# Patient Record
Sex: Male | Born: 1935 | Race: White | Hispanic: No | Marital: Single | Smoking: Former smoker
Health system: Southern US, Community
[De-identification: ages and names within clinical notes are randomized; demographics above are authoritative.]

## PROBLEM LIST (undated history)

## (undated) DIAGNOSIS — M1990 Unspecified osteoarthritis, unspecified site: Secondary | ICD-10-CM

## (undated) DIAGNOSIS — I10 Essential (primary) hypertension: Secondary | ICD-10-CM

## (undated) DIAGNOSIS — I456 Pre-excitation syndrome: Secondary | ICD-10-CM

## (undated) DIAGNOSIS — I639 Cerebral infarction, unspecified: Secondary | ICD-10-CM

## (undated) DIAGNOSIS — I4891 Unspecified atrial fibrillation: Secondary | ICD-10-CM

## (undated) DIAGNOSIS — M109 Gout, unspecified: Secondary | ICD-10-CM

## (undated) DIAGNOSIS — C679 Malignant neoplasm of bladder, unspecified: Secondary | ICD-10-CM

## (undated) DIAGNOSIS — L8990 Pressure ulcer of unspecified site, unspecified stage: Secondary | ICD-10-CM

## (undated) DIAGNOSIS — E039 Hypothyroidism, unspecified: Secondary | ICD-10-CM

## (undated) DIAGNOSIS — E785 Hyperlipidemia, unspecified: Secondary | ICD-10-CM

## (undated) DIAGNOSIS — D649 Anemia, unspecified: Secondary | ICD-10-CM

## (undated) DIAGNOSIS — Z8719 Personal history of other diseases of the digestive system: Secondary | ICD-10-CM

## (undated) DIAGNOSIS — I714 Abdominal aortic aneurysm, without rupture, unspecified: Secondary | ICD-10-CM

## (undated) DIAGNOSIS — S32402A Unspecified fracture of left acetabulum, initial encounter for closed fracture: Secondary | ICD-10-CM

## (undated) HISTORY — DX: Abdominal aortic aneurysm, without rupture, unspecified: I71.40

## (undated) HISTORY — PX: CHOLECYSTECTOMY: SHX55

## (undated) HISTORY — DX: Abdominal aortic aneurysm, without rupture: I71.4

## (undated) HISTORY — PX: TONSILLECTOMY: SUR1361

## (undated) HISTORY — DX: Pre-excitation syndrome: I45.6

## (undated) HISTORY — PX: CATARACT EXTRACTION: SUR2

## (undated) HISTORY — DX: Anemia, unspecified: D64.9

## (undated) HISTORY — DX: Unspecified fracture of left acetabulum, initial encounter for closed fracture: S32.402A

## (undated) HISTORY — PX: KNEE SURGERY: SHX244

## (undated) HISTORY — DX: Unspecified osteoarthritis, unspecified site: M19.90

---

## 2012-08-13 DIAGNOSIS — H903 Sensorineural hearing loss, bilateral: Secondary | ICD-10-CM | POA: Insufficient documentation

## 2012-08-13 DIAGNOSIS — H905 Unspecified sensorineural hearing loss: Secondary | ICD-10-CM | POA: Insufficient documentation

## 2013-03-04 DIAGNOSIS — I10 Essential (primary) hypertension: Secondary | ICD-10-CM | POA: Diagnosis not present

## 2013-04-10 DIAGNOSIS — K921 Melena: Secondary | ICD-10-CM | POA: Diagnosis not present

## 2013-04-10 DIAGNOSIS — I6350 Cerebral infarction due to unspecified occlusion or stenosis of unspecified cerebral artery: Secondary | ICD-10-CM | POA: Diagnosis not present

## 2013-04-10 DIAGNOSIS — K625 Hemorrhage of anus and rectum: Secondary | ICD-10-CM | POA: Diagnosis not present

## 2013-04-10 DIAGNOSIS — Z8673 Personal history of transient ischemic attack (TIA), and cerebral infarction without residual deficits: Secondary | ICD-10-CM | POA: Insufficient documentation

## 2013-04-10 DIAGNOSIS — E039 Hypothyroidism, unspecified: Secondary | ICD-10-CM | POA: Diagnosis present

## 2013-04-10 DIAGNOSIS — Z8551 Personal history of malignant neoplasm of bladder: Secondary | ICD-10-CM | POA: Diagnosis not present

## 2013-04-10 DIAGNOSIS — K5730 Diverticulosis of large intestine without perforation or abscess without bleeding: Secondary | ICD-10-CM | POA: Diagnosis not present

## 2013-04-10 DIAGNOSIS — D649 Anemia, unspecified: Secondary | ICD-10-CM | POA: Diagnosis not present

## 2013-04-10 DIAGNOSIS — M129 Arthropathy, unspecified: Secondary | ICD-10-CM | POA: Diagnosis not present

## 2013-04-10 DIAGNOSIS — K922 Gastrointestinal hemorrhage, unspecified: Secondary | ICD-10-CM | POA: Diagnosis not present

## 2013-04-10 DIAGNOSIS — I10 Essential (primary) hypertension: Secondary | ICD-10-CM | POA: Diagnosis not present

## 2013-04-10 DIAGNOSIS — K5731 Diverticulosis of large intestine without perforation or abscess with bleeding: Secondary | ICD-10-CM | POA: Diagnosis not present

## 2013-04-22 DIAGNOSIS — I10 Essential (primary) hypertension: Secondary | ICD-10-CM | POA: Diagnosis not present

## 2013-05-20 DIAGNOSIS — R279 Unspecified lack of coordination: Secondary | ICD-10-CM | POA: Diagnosis not present

## 2013-05-20 DIAGNOSIS — I10 Essential (primary) hypertension: Secondary | ICD-10-CM | POA: Diagnosis not present

## 2013-05-20 DIAGNOSIS — Z79899 Other long term (current) drug therapy: Secondary | ICD-10-CM | POA: Diagnosis not present

## 2013-05-22 DIAGNOSIS — R279 Unspecified lack of coordination: Secondary | ICD-10-CM | POA: Diagnosis not present

## 2013-05-22 DIAGNOSIS — I10 Essential (primary) hypertension: Secondary | ICD-10-CM | POA: Diagnosis not present

## 2013-05-27 DIAGNOSIS — M47812 Spondylosis without myelopathy or radiculopathy, cervical region: Secondary | ICD-10-CM | POA: Diagnosis not present

## 2013-05-27 DIAGNOSIS — M5137 Other intervertebral disc degeneration, lumbosacral region: Secondary | ICD-10-CM | POA: Diagnosis not present

## 2013-05-27 DIAGNOSIS — M48061 Spinal stenosis, lumbar region without neurogenic claudication: Secondary | ICD-10-CM | POA: Diagnosis not present

## 2013-05-27 DIAGNOSIS — R29898 Other symptoms and signs involving the musculoskeletal system: Secondary | ICD-10-CM | POA: Diagnosis not present

## 2013-05-27 DIAGNOSIS — R279 Unspecified lack of coordination: Secondary | ICD-10-CM | POA: Diagnosis not present

## 2013-05-27 DIAGNOSIS — M5126 Other intervertebral disc displacement, lumbar region: Secondary | ICD-10-CM | POA: Diagnosis not present

## 2013-05-28 DIAGNOSIS — N401 Enlarged prostate with lower urinary tract symptoms: Secondary | ICD-10-CM | POA: Diagnosis not present

## 2013-05-28 DIAGNOSIS — R351 Nocturia: Secondary | ICD-10-CM | POA: Diagnosis not present

## 2013-05-28 DIAGNOSIS — N138 Other obstructive and reflux uropathy: Secondary | ICD-10-CM | POA: Diagnosis not present

## 2013-05-28 DIAGNOSIS — Z8551 Personal history of malignant neoplasm of bladder: Secondary | ICD-10-CM | POA: Diagnosis not present

## 2013-06-03 DIAGNOSIS — R269 Unspecified abnormalities of gait and mobility: Secondary | ICD-10-CM | POA: Diagnosis not present

## 2013-06-03 DIAGNOSIS — M6281 Muscle weakness (generalized): Secondary | ICD-10-CM | POA: Diagnosis not present

## 2013-06-03 DIAGNOSIS — R279 Unspecified lack of coordination: Secondary | ICD-10-CM | POA: Diagnosis not present

## 2013-06-03 DIAGNOSIS — R5381 Other malaise: Secondary | ICD-10-CM | POA: Diagnosis not present

## 2013-06-03 DIAGNOSIS — R5383 Other fatigue: Secondary | ICD-10-CM | POA: Diagnosis not present

## 2013-06-06 DIAGNOSIS — Z8551 Personal history of malignant neoplasm of bladder: Secondary | ICD-10-CM | POA: Diagnosis not present

## 2013-06-10 DIAGNOSIS — R5381 Other malaise: Secondary | ICD-10-CM | POA: Diagnosis not present

## 2013-06-10 DIAGNOSIS — R279 Unspecified lack of coordination: Secondary | ICD-10-CM | POA: Diagnosis not present

## 2013-06-10 DIAGNOSIS — R5383 Other fatigue: Secondary | ICD-10-CM | POA: Diagnosis not present

## 2013-06-10 DIAGNOSIS — R269 Unspecified abnormalities of gait and mobility: Secondary | ICD-10-CM | POA: Diagnosis not present

## 2013-06-10 DIAGNOSIS — M6281 Muscle weakness (generalized): Secondary | ICD-10-CM | POA: Diagnosis not present

## 2013-06-13 DIAGNOSIS — M6281 Muscle weakness (generalized): Secondary | ICD-10-CM | POA: Diagnosis not present

## 2013-06-13 DIAGNOSIS — R5383 Other fatigue: Secondary | ICD-10-CM | POA: Diagnosis not present

## 2013-06-13 DIAGNOSIS — R5381 Other malaise: Secondary | ICD-10-CM | POA: Diagnosis not present

## 2013-06-13 DIAGNOSIS — R279 Unspecified lack of coordination: Secondary | ICD-10-CM | POA: Diagnosis not present

## 2013-06-13 DIAGNOSIS — R269 Unspecified abnormalities of gait and mobility: Secondary | ICD-10-CM | POA: Diagnosis not present

## 2013-06-17 DIAGNOSIS — M6281 Muscle weakness (generalized): Secondary | ICD-10-CM | POA: Diagnosis not present

## 2013-06-17 DIAGNOSIS — R279 Unspecified lack of coordination: Secondary | ICD-10-CM | POA: Diagnosis not present

## 2013-06-17 DIAGNOSIS — R5381 Other malaise: Secondary | ICD-10-CM | POA: Diagnosis not present

## 2013-06-17 DIAGNOSIS — R269 Unspecified abnormalities of gait and mobility: Secondary | ICD-10-CM | POA: Diagnosis not present

## 2013-06-20 DIAGNOSIS — M6281 Muscle weakness (generalized): Secondary | ICD-10-CM | POA: Diagnosis not present

## 2013-06-20 DIAGNOSIS — R279 Unspecified lack of coordination: Secondary | ICD-10-CM | POA: Diagnosis not present

## 2013-06-20 DIAGNOSIS — R269 Unspecified abnormalities of gait and mobility: Secondary | ICD-10-CM | POA: Diagnosis not present

## 2013-06-20 DIAGNOSIS — R5383 Other fatigue: Secondary | ICD-10-CM | POA: Diagnosis not present

## 2013-06-20 DIAGNOSIS — R5381 Other malaise: Secondary | ICD-10-CM | POA: Diagnosis not present

## 2013-06-24 DIAGNOSIS — R269 Unspecified abnormalities of gait and mobility: Secondary | ICD-10-CM | POA: Diagnosis not present

## 2013-06-24 DIAGNOSIS — R279 Unspecified lack of coordination: Secondary | ICD-10-CM | POA: Diagnosis not present

## 2013-06-24 DIAGNOSIS — R5383 Other fatigue: Secondary | ICD-10-CM | POA: Diagnosis not present

## 2013-06-24 DIAGNOSIS — R5381 Other malaise: Secondary | ICD-10-CM | POA: Diagnosis not present

## 2013-06-24 DIAGNOSIS — M6281 Muscle weakness (generalized): Secondary | ICD-10-CM | POA: Diagnosis not present

## 2013-06-27 DIAGNOSIS — R269 Unspecified abnormalities of gait and mobility: Secondary | ICD-10-CM | POA: Diagnosis not present

## 2013-06-27 DIAGNOSIS — M6281 Muscle weakness (generalized): Secondary | ICD-10-CM | POA: Diagnosis not present

## 2013-06-27 DIAGNOSIS — R5381 Other malaise: Secondary | ICD-10-CM | POA: Diagnosis not present

## 2013-06-27 DIAGNOSIS — R279 Unspecified lack of coordination: Secondary | ICD-10-CM | POA: Diagnosis not present

## 2013-06-27 DIAGNOSIS — R5383 Other fatigue: Secondary | ICD-10-CM | POA: Diagnosis not present

## 2013-07-01 DIAGNOSIS — R5383 Other fatigue: Secondary | ICD-10-CM | POA: Diagnosis not present

## 2013-07-01 DIAGNOSIS — R279 Unspecified lack of coordination: Secondary | ICD-10-CM | POA: Diagnosis not present

## 2013-07-01 DIAGNOSIS — M6281 Muscle weakness (generalized): Secondary | ICD-10-CM | POA: Diagnosis not present

## 2013-07-01 DIAGNOSIS — R5381 Other malaise: Secondary | ICD-10-CM | POA: Diagnosis not present

## 2013-07-01 DIAGNOSIS — R269 Unspecified abnormalities of gait and mobility: Secondary | ICD-10-CM | POA: Diagnosis not present

## 2013-07-03 DIAGNOSIS — R5383 Other fatigue: Secondary | ICD-10-CM | POA: Diagnosis not present

## 2013-07-03 DIAGNOSIS — R279 Unspecified lack of coordination: Secondary | ICD-10-CM | POA: Diagnosis not present

## 2013-07-03 DIAGNOSIS — R269 Unspecified abnormalities of gait and mobility: Secondary | ICD-10-CM | POA: Diagnosis not present

## 2013-07-03 DIAGNOSIS — R5381 Other malaise: Secondary | ICD-10-CM | POA: Diagnosis not present

## 2013-07-03 DIAGNOSIS — M6281 Muscle weakness (generalized): Secondary | ICD-10-CM | POA: Diagnosis not present

## 2013-07-08 DIAGNOSIS — R269 Unspecified abnormalities of gait and mobility: Secondary | ICD-10-CM | POA: Diagnosis not present

## 2013-07-08 DIAGNOSIS — R5383 Other fatigue: Secondary | ICD-10-CM | POA: Diagnosis not present

## 2013-07-08 DIAGNOSIS — R279 Unspecified lack of coordination: Secondary | ICD-10-CM | POA: Diagnosis not present

## 2013-07-08 DIAGNOSIS — M6281 Muscle weakness (generalized): Secondary | ICD-10-CM | POA: Diagnosis not present

## 2013-07-08 DIAGNOSIS — R5381 Other malaise: Secondary | ICD-10-CM | POA: Diagnosis not present

## 2013-07-11 DIAGNOSIS — M6281 Muscle weakness (generalized): Secondary | ICD-10-CM | POA: Diagnosis not present

## 2013-07-11 DIAGNOSIS — R269 Unspecified abnormalities of gait and mobility: Secondary | ICD-10-CM | POA: Diagnosis not present

## 2013-07-11 DIAGNOSIS — R5381 Other malaise: Secondary | ICD-10-CM | POA: Diagnosis not present

## 2013-07-11 DIAGNOSIS — R279 Unspecified lack of coordination: Secondary | ICD-10-CM | POA: Diagnosis not present

## 2013-07-11 DIAGNOSIS — R5383 Other fatigue: Secondary | ICD-10-CM | POA: Diagnosis not present

## 2013-07-15 DIAGNOSIS — R5381 Other malaise: Secondary | ICD-10-CM | POA: Diagnosis not present

## 2013-07-15 DIAGNOSIS — M6281 Muscle weakness (generalized): Secondary | ICD-10-CM | POA: Diagnosis not present

## 2013-07-15 DIAGNOSIS — R269 Unspecified abnormalities of gait and mobility: Secondary | ICD-10-CM | POA: Diagnosis not present

## 2013-07-15 DIAGNOSIS — R279 Unspecified lack of coordination: Secondary | ICD-10-CM | POA: Diagnosis not present

## 2013-07-15 DIAGNOSIS — R5383 Other fatigue: Secondary | ICD-10-CM | POA: Diagnosis not present

## 2013-07-18 DIAGNOSIS — M6281 Muscle weakness (generalized): Secondary | ICD-10-CM | POA: Diagnosis not present

## 2013-07-18 DIAGNOSIS — R279 Unspecified lack of coordination: Secondary | ICD-10-CM | POA: Diagnosis not present

## 2013-07-18 DIAGNOSIS — R269 Unspecified abnormalities of gait and mobility: Secondary | ICD-10-CM | POA: Diagnosis not present

## 2013-07-18 DIAGNOSIS — R5383 Other fatigue: Secondary | ICD-10-CM | POA: Diagnosis not present

## 2013-07-18 DIAGNOSIS — R5381 Other malaise: Secondary | ICD-10-CM | POA: Diagnosis not present

## 2013-07-22 DIAGNOSIS — R279 Unspecified lack of coordination: Secondary | ICD-10-CM | POA: Diagnosis not present

## 2013-07-22 DIAGNOSIS — R5381 Other malaise: Secondary | ICD-10-CM | POA: Diagnosis not present

## 2013-07-22 DIAGNOSIS — R5383 Other fatigue: Secondary | ICD-10-CM | POA: Diagnosis not present

## 2013-07-22 DIAGNOSIS — R269 Unspecified abnormalities of gait and mobility: Secondary | ICD-10-CM | POA: Diagnosis not present

## 2013-07-22 DIAGNOSIS — M6281 Muscle weakness (generalized): Secondary | ICD-10-CM | POA: Diagnosis not present

## 2013-07-25 DIAGNOSIS — M6281 Muscle weakness (generalized): Secondary | ICD-10-CM | POA: Diagnosis not present

## 2013-07-25 DIAGNOSIS — R269 Unspecified abnormalities of gait and mobility: Secondary | ICD-10-CM | POA: Diagnosis not present

## 2013-07-25 DIAGNOSIS — R5383 Other fatigue: Secondary | ICD-10-CM | POA: Diagnosis not present

## 2013-07-25 DIAGNOSIS — R5381 Other malaise: Secondary | ICD-10-CM | POA: Diagnosis not present

## 2013-07-25 DIAGNOSIS — R279 Unspecified lack of coordination: Secondary | ICD-10-CM | POA: Diagnosis not present

## 2013-07-29 DIAGNOSIS — R269 Unspecified abnormalities of gait and mobility: Secondary | ICD-10-CM | POA: Diagnosis not present

## 2013-07-29 DIAGNOSIS — M6281 Muscle weakness (generalized): Secondary | ICD-10-CM | POA: Diagnosis not present

## 2013-07-29 DIAGNOSIS — R5383 Other fatigue: Secondary | ICD-10-CM | POA: Diagnosis not present

## 2013-07-29 DIAGNOSIS — R5381 Other malaise: Secondary | ICD-10-CM | POA: Diagnosis not present

## 2013-07-29 DIAGNOSIS — R279 Unspecified lack of coordination: Secondary | ICD-10-CM | POA: Diagnosis not present

## 2013-08-01 DIAGNOSIS — R5383 Other fatigue: Secondary | ICD-10-CM | POA: Diagnosis not present

## 2013-08-01 DIAGNOSIS — R269 Unspecified abnormalities of gait and mobility: Secondary | ICD-10-CM | POA: Diagnosis not present

## 2013-08-01 DIAGNOSIS — R5381 Other malaise: Secondary | ICD-10-CM | POA: Diagnosis not present

## 2013-08-01 DIAGNOSIS — R279 Unspecified lack of coordination: Secondary | ICD-10-CM | POA: Diagnosis not present

## 2013-08-01 DIAGNOSIS — M6281 Muscle weakness (generalized): Secondary | ICD-10-CM | POA: Diagnosis not present

## 2013-08-05 DIAGNOSIS — R5383 Other fatigue: Secondary | ICD-10-CM | POA: Diagnosis not present

## 2013-08-05 DIAGNOSIS — R269 Unspecified abnormalities of gait and mobility: Secondary | ICD-10-CM | POA: Diagnosis not present

## 2013-08-05 DIAGNOSIS — R279 Unspecified lack of coordination: Secondary | ICD-10-CM | POA: Diagnosis not present

## 2013-08-05 DIAGNOSIS — M6281 Muscle weakness (generalized): Secondary | ICD-10-CM | POA: Diagnosis not present

## 2013-08-05 DIAGNOSIS — R5381 Other malaise: Secondary | ICD-10-CM | POA: Diagnosis not present

## 2013-08-08 DIAGNOSIS — R279 Unspecified lack of coordination: Secondary | ICD-10-CM | POA: Diagnosis not present

## 2013-08-08 DIAGNOSIS — R269 Unspecified abnormalities of gait and mobility: Secondary | ICD-10-CM | POA: Diagnosis not present

## 2013-08-08 DIAGNOSIS — R5381 Other malaise: Secondary | ICD-10-CM | POA: Diagnosis not present

## 2013-08-08 DIAGNOSIS — R5383 Other fatigue: Secondary | ICD-10-CM | POA: Diagnosis not present

## 2013-08-08 DIAGNOSIS — M6281 Muscle weakness (generalized): Secondary | ICD-10-CM | POA: Diagnosis not present

## 2013-08-26 DIAGNOSIS — E559 Vitamin D deficiency, unspecified: Secondary | ICD-10-CM | POA: Diagnosis not present

## 2013-08-26 DIAGNOSIS — Z79899 Other long term (current) drug therapy: Secondary | ICD-10-CM | POA: Diagnosis not present

## 2013-08-26 DIAGNOSIS — E291 Testicular hypofunction: Secondary | ICD-10-CM | POA: Diagnosis not present

## 2013-08-26 DIAGNOSIS — E785 Hyperlipidemia, unspecified: Secondary | ICD-10-CM | POA: Diagnosis not present

## 2013-08-26 DIAGNOSIS — D509 Iron deficiency anemia, unspecified: Secondary | ICD-10-CM | POA: Diagnosis not present

## 2013-08-26 DIAGNOSIS — I10 Essential (primary) hypertension: Secondary | ICD-10-CM | POA: Diagnosis not present

## 2013-08-26 DIAGNOSIS — R7989 Other specified abnormal findings of blood chemistry: Secondary | ICD-10-CM | POA: Diagnosis not present

## 2013-08-26 DIAGNOSIS — Z125 Encounter for screening for malignant neoplasm of prostate: Secondary | ICD-10-CM | POA: Diagnosis not present

## 2013-08-26 DIAGNOSIS — E039 Hypothyroidism, unspecified: Secondary | ICD-10-CM | POA: Diagnosis not present

## 2013-09-01 DIAGNOSIS — I4891 Unspecified atrial fibrillation: Secondary | ICD-10-CM | POA: Diagnosis not present

## 2013-09-01 DIAGNOSIS — Z1211 Encounter for screening for malignant neoplasm of colon: Secondary | ICD-10-CM | POA: Diagnosis not present

## 2013-09-01 DIAGNOSIS — M109 Gout, unspecified: Secondary | ICD-10-CM | POA: Diagnosis not present

## 2013-09-01 DIAGNOSIS — I10 Essential (primary) hypertension: Secondary | ICD-10-CM | POA: Diagnosis not present

## 2013-09-01 DIAGNOSIS — R279 Unspecified lack of coordination: Secondary | ICD-10-CM | POA: Diagnosis not present

## 2013-09-12 DIAGNOSIS — L0291 Cutaneous abscess, unspecified: Secondary | ICD-10-CM | POA: Diagnosis not present

## 2013-09-12 DIAGNOSIS — L0390 Cellulitis, unspecified: Secondary | ICD-10-CM | POA: Diagnosis not present

## 2013-09-26 DIAGNOSIS — R609 Edema, unspecified: Secondary | ICD-10-CM | POA: Diagnosis not present

## 2013-09-26 DIAGNOSIS — I658 Occlusion and stenosis of other precerebral arteries: Secondary | ICD-10-CM | POA: Diagnosis not present

## 2013-09-26 DIAGNOSIS — I714 Abdominal aortic aneurysm, without rupture, unspecified: Secondary | ICD-10-CM | POA: Diagnosis not present

## 2013-09-26 DIAGNOSIS — I6529 Occlusion and stenosis of unspecified carotid artery: Secondary | ICD-10-CM | POA: Diagnosis not present

## 2013-09-26 DIAGNOSIS — M7989 Other specified soft tissue disorders: Secondary | ICD-10-CM | POA: Diagnosis not present

## 2013-10-16 DIAGNOSIS — M109 Gout, unspecified: Secondary | ICD-10-CM | POA: Diagnosis not present

## 2013-12-03 DIAGNOSIS — N401 Enlarged prostate with lower urinary tract symptoms: Secondary | ICD-10-CM | POA: Diagnosis not present

## 2013-12-03 DIAGNOSIS — R351 Nocturia: Secondary | ICD-10-CM | POA: Diagnosis not present

## 2013-12-03 DIAGNOSIS — Z8551 Personal history of malignant neoplasm of bladder: Secondary | ICD-10-CM | POA: Diagnosis not present

## 2013-12-16 DIAGNOSIS — Z8551 Personal history of malignant neoplasm of bladder: Secondary | ICD-10-CM | POA: Diagnosis not present

## 2014-01-01 DIAGNOSIS — G459 Transient cerebral ischemic attack, unspecified: Secondary | ICD-10-CM | POA: Diagnosis not present

## 2014-01-01 DIAGNOSIS — Z23 Encounter for immunization: Secondary | ICD-10-CM | POA: Diagnosis not present

## 2014-03-26 ENCOUNTER — Other Ambulatory Visit: Payer: Self-pay | Admitting: Geriatric Medicine

## 2014-03-26 DIAGNOSIS — I10 Essential (primary) hypertension: Secondary | ICD-10-CM | POA: Diagnosis not present

## 2014-03-26 DIAGNOSIS — E039 Hypothyroidism, unspecified: Secondary | ICD-10-CM | POA: Diagnosis not present

## 2014-03-26 DIAGNOSIS — R609 Edema, unspecified: Secondary | ICD-10-CM | POA: Diagnosis not present

## 2014-03-26 DIAGNOSIS — I714 Abdominal aortic aneurysm, without rupture, unspecified: Secondary | ICD-10-CM

## 2014-03-26 DIAGNOSIS — E780 Pure hypercholesterolemia: Secondary | ICD-10-CM | POA: Diagnosis not present

## 2014-04-03 ENCOUNTER — Ambulatory Visit
Admission: RE | Admit: 2014-04-03 | Discharge: 2014-04-03 | Disposition: A | Discharge status: Home / Self Care | Payer: Medicare Other | Source: Ambulatory Visit | Attending: Geriatric Medicine | Admitting: Geriatric Medicine

## 2014-04-03 ENCOUNTER — Ambulatory Visit: Payer: Self-pay

## 2014-04-03 DIAGNOSIS — I714 Abdominal aortic aneurysm, without rupture, unspecified: Secondary | ICD-10-CM

## 2014-06-03 DIAGNOSIS — N401 Enlarged prostate with lower urinary tract symptoms: Secondary | ICD-10-CM | POA: Diagnosis not present

## 2014-06-03 DIAGNOSIS — Z8551 Personal history of malignant neoplasm of bladder: Secondary | ICD-10-CM | POA: Diagnosis not present

## 2014-06-03 DIAGNOSIS — D494 Neoplasm of unspecified behavior of bladder: Secondary | ICD-10-CM | POA: Diagnosis not present

## 2014-06-03 DIAGNOSIS — R3912 Poor urinary stream: Secondary | ICD-10-CM | POA: Diagnosis not present

## 2014-06-12 DIAGNOSIS — Z8673 Personal history of transient ischemic attack (TIA), and cerebral infarction without residual deficits: Secondary | ICD-10-CM | POA: Diagnosis not present

## 2014-06-12 DIAGNOSIS — D649 Anemia, unspecified: Secondary | ICD-10-CM | POA: Diagnosis not present

## 2014-06-12 DIAGNOSIS — E039 Hypothyroidism, unspecified: Secondary | ICD-10-CM | POA: Diagnosis not present

## 2014-06-12 DIAGNOSIS — M109 Gout, unspecified: Secondary | ICD-10-CM | POA: Diagnosis not present

## 2014-06-12 DIAGNOSIS — Z01818 Encounter for other preprocedural examination: Secondary | ICD-10-CM | POA: Diagnosis not present

## 2014-06-12 DIAGNOSIS — I714 Abdominal aortic aneurysm, without rupture, unspecified: Secondary | ICD-10-CM | POA: Insufficient documentation

## 2014-06-12 DIAGNOSIS — E785 Hyperlipidemia, unspecified: Secondary | ICD-10-CM | POA: Diagnosis not present

## 2014-06-12 DIAGNOSIS — D509 Iron deficiency anemia, unspecified: Secondary | ICD-10-CM | POA: Insufficient documentation

## 2014-06-12 DIAGNOSIS — I10 Essential (primary) hypertension: Secondary | ICD-10-CM | POA: Diagnosis not present

## 2014-06-18 DIAGNOSIS — Z8551 Personal history of malignant neoplasm of bladder: Secondary | ICD-10-CM | POA: Diagnosis not present

## 2014-06-18 DIAGNOSIS — E079 Disorder of thyroid, unspecified: Secondary | ICD-10-CM | POA: Diagnosis not present

## 2014-06-18 DIAGNOSIS — Z8673 Personal history of transient ischemic attack (TIA), and cerebral infarction without residual deficits: Secondary | ICD-10-CM | POA: Diagnosis not present

## 2014-06-18 DIAGNOSIS — I10 Essential (primary) hypertension: Secondary | ICD-10-CM | POA: Diagnosis not present

## 2014-06-18 DIAGNOSIS — C672 Malignant neoplasm of lateral wall of bladder: Secondary | ICD-10-CM | POA: Diagnosis not present

## 2014-06-18 DIAGNOSIS — Z87891 Personal history of nicotine dependence: Secondary | ICD-10-CM | POA: Diagnosis not present

## 2014-06-18 DIAGNOSIS — M1990 Unspecified osteoarthritis, unspecified site: Secondary | ICD-10-CM | POA: Diagnosis not present

## 2014-06-18 DIAGNOSIS — D494 Neoplasm of unspecified behavior of bladder: Secondary | ICD-10-CM | POA: Diagnosis not present

## 2014-06-18 HISTORY — PX: CYSTOURETHROSCOPY: SHX476

## 2014-07-23 DIAGNOSIS — Z79899 Other long term (current) drug therapy: Secondary | ICD-10-CM | POA: Diagnosis not present

## 2014-07-23 DIAGNOSIS — L989 Disorder of the skin and subcutaneous tissue, unspecified: Secondary | ICD-10-CM | POA: Diagnosis not present

## 2014-07-23 DIAGNOSIS — E780 Pure hypercholesterolemia: Secondary | ICD-10-CM | POA: Diagnosis not present

## 2014-07-23 DIAGNOSIS — I10 Essential (primary) hypertension: Secondary | ICD-10-CM | POA: Diagnosis not present

## 2014-10-14 DIAGNOSIS — Z8551 Personal history of malignant neoplasm of bladder: Secondary | ICD-10-CM | POA: Diagnosis not present

## 2014-10-14 DIAGNOSIS — R350 Frequency of micturition: Secondary | ICD-10-CM | POA: Diagnosis not present

## 2014-10-14 DIAGNOSIS — N401 Enlarged prostate with lower urinary tract symptoms: Secondary | ICD-10-CM | POA: Diagnosis not present

## 2014-10-21 DIAGNOSIS — Z8551 Personal history of malignant neoplasm of bladder: Secondary | ICD-10-CM | POA: Diagnosis not present

## 2014-11-19 DIAGNOSIS — E780 Pure hypercholesterolemia: Secondary | ICD-10-CM | POA: Diagnosis not present

## 2014-11-19 DIAGNOSIS — I714 Abdominal aortic aneurysm, without rupture: Secondary | ICD-10-CM | POA: Diagnosis not present

## 2014-11-19 DIAGNOSIS — Z1389 Encounter for screening for other disorder: Secondary | ICD-10-CM | POA: Diagnosis not present

## 2014-11-19 DIAGNOSIS — Z79899 Other long term (current) drug therapy: Secondary | ICD-10-CM | POA: Diagnosis not present

## 2014-11-19 DIAGNOSIS — I10 Essential (primary) hypertension: Secondary | ICD-10-CM | POA: Diagnosis not present

## 2014-11-19 DIAGNOSIS — Z0000 Encounter for general adult medical examination without abnormal findings: Secondary | ICD-10-CM | POA: Diagnosis not present

## 2015-03-22 DIAGNOSIS — R51 Headache: Secondary | ICD-10-CM | POA: Diagnosis not present

## 2015-03-22 DIAGNOSIS — S0101XA Laceration without foreign body of scalp, initial encounter: Secondary | ICD-10-CM | POA: Diagnosis not present

## 2015-03-22 DIAGNOSIS — W19XXXA Unspecified fall, initial encounter: Secondary | ICD-10-CM | POA: Diagnosis not present

## 2015-03-22 DIAGNOSIS — S0191XA Laceration without foreign body of unspecified part of head, initial encounter: Secondary | ICD-10-CM | POA: Diagnosis not present

## 2015-03-22 DIAGNOSIS — M47812 Spondylosis without myelopathy or radiculopathy, cervical region: Secondary | ICD-10-CM | POA: Diagnosis not present

## 2015-03-22 DIAGNOSIS — Z87891 Personal history of nicotine dependence: Secondary | ICD-10-CM | POA: Diagnosis not present

## 2015-03-22 DIAGNOSIS — Z79899 Other long term (current) drug therapy: Secondary | ICD-10-CM | POA: Diagnosis not present

## 2015-03-22 DIAGNOSIS — I638 Other cerebral infarction: Secondary | ICD-10-CM | POA: Diagnosis not present

## 2015-03-22 DIAGNOSIS — T07 Unspecified multiple injuries: Secondary | ICD-10-CM | POA: Diagnosis not present

## 2015-03-22 DIAGNOSIS — I639 Cerebral infarction, unspecified: Secondary | ICD-10-CM | POA: Diagnosis not present

## 2015-03-22 DIAGNOSIS — W1830XA Fall on same level, unspecified, initial encounter: Secondary | ICD-10-CM | POA: Diagnosis not present

## 2015-03-22 DIAGNOSIS — M79621 Pain in right upper arm: Secondary | ICD-10-CM | POA: Diagnosis not present

## 2015-03-23 DIAGNOSIS — M6250 Muscle wasting and atrophy, not elsewhere classified, unspecified site: Secondary | ICD-10-CM | POA: Diagnosis not present

## 2015-03-23 DIAGNOSIS — I6350 Cerebral infarction due to unspecified occlusion or stenosis of unspecified cerebral artery: Secondary | ICD-10-CM | POA: Diagnosis not present

## 2015-03-25 ENCOUNTER — Other Ambulatory Visit: Payer: Self-pay | Admitting: Geriatric Medicine

## 2015-03-25 ENCOUNTER — Ambulatory Visit
Admission: RE | Admit: 2015-03-25 | Discharge: 2015-03-25 | Disposition: A | Discharge status: Home / Self Care | Payer: Medicare Other | Source: Ambulatory Visit | Attending: Geriatric Medicine | Admitting: Geriatric Medicine

## 2015-03-25 DIAGNOSIS — M25552 Pain in left hip: Secondary | ICD-10-CM

## 2015-03-25 DIAGNOSIS — Z4802 Encounter for removal of sutures: Secondary | ICD-10-CM | POA: Diagnosis not present

## 2015-04-14 DIAGNOSIS — R350 Frequency of micturition: Secondary | ICD-10-CM | POA: Diagnosis not present

## 2015-04-14 DIAGNOSIS — N401 Enlarged prostate with lower urinary tract symptoms: Secondary | ICD-10-CM | POA: Diagnosis not present

## 2015-04-14 DIAGNOSIS — Z8551 Personal history of malignant neoplasm of bladder: Secondary | ICD-10-CM | POA: Diagnosis not present

## 2015-05-27 ENCOUNTER — Other Ambulatory Visit: Payer: Self-pay | Admitting: Geriatric Medicine

## 2015-05-27 DIAGNOSIS — E7800 Pure hypercholesterolemia, unspecified: Secondary | ICD-10-CM | POA: Diagnosis not present

## 2015-05-27 DIAGNOSIS — Z79899 Other long term (current) drug therapy: Secondary | ICD-10-CM | POA: Diagnosis not present

## 2015-05-27 DIAGNOSIS — I714 Abdominal aortic aneurysm, without rupture, unspecified: Secondary | ICD-10-CM

## 2015-05-27 DIAGNOSIS — I10 Essential (primary) hypertension: Secondary | ICD-10-CM | POA: Diagnosis not present

## 2015-05-27 DIAGNOSIS — E039 Hypothyroidism, unspecified: Secondary | ICD-10-CM | POA: Diagnosis not present

## 2015-05-27 DIAGNOSIS — R000 Tachycardia, unspecified: Secondary | ICD-10-CM | POA: Diagnosis not present

## 2015-05-27 DIAGNOSIS — I480 Paroxysmal atrial fibrillation: Secondary | ICD-10-CM | POA: Diagnosis not present

## 2015-06-01 ENCOUNTER — Ambulatory Visit
Admission: RE | Admit: 2015-06-01 | Discharge: 2015-06-01 | Disposition: A | Discharge status: Home / Self Care | Payer: Medicare Other | Source: Ambulatory Visit | Attending: Geriatric Medicine | Admitting: Geriatric Medicine

## 2015-06-01 ENCOUNTER — Other Ambulatory Visit (HOSPITAL_COMMUNITY): Payer: Self-pay | Admitting: Geriatric Medicine

## 2015-06-01 DIAGNOSIS — I714 Abdominal aortic aneurysm, without rupture, unspecified: Secondary | ICD-10-CM

## 2015-06-01 DIAGNOSIS — I480 Paroxysmal atrial fibrillation: Secondary | ICD-10-CM

## 2015-06-03 DIAGNOSIS — I10 Essential (primary) hypertension: Secondary | ICD-10-CM | POA: Diagnosis not present

## 2015-06-03 DIAGNOSIS — I480 Paroxysmal atrial fibrillation: Secondary | ICD-10-CM | POA: Diagnosis not present

## 2015-06-15 ENCOUNTER — Ambulatory Visit (HOSPITAL_COMMUNITY): Payer: Medicare Other | Attending: Internal Medicine

## 2015-06-15 ENCOUNTER — Other Ambulatory Visit: Payer: Self-pay

## 2015-06-15 DIAGNOSIS — I119 Hypertensive heart disease without heart failure: Secondary | ICD-10-CM | POA: Insufficient documentation

## 2015-06-15 DIAGNOSIS — I071 Rheumatic tricuspid insufficiency: Secondary | ICD-10-CM | POA: Insufficient documentation

## 2015-06-15 DIAGNOSIS — Z87891 Personal history of nicotine dependence: Secondary | ICD-10-CM | POA: Insufficient documentation

## 2015-06-15 DIAGNOSIS — E785 Hyperlipidemia, unspecified: Secondary | ICD-10-CM | POA: Diagnosis not present

## 2015-06-15 DIAGNOSIS — I480 Paroxysmal atrial fibrillation: Secondary | ICD-10-CM | POA: Insufficient documentation

## 2015-06-15 DIAGNOSIS — I340 Nonrheumatic mitral (valve) insufficiency: Secondary | ICD-10-CM | POA: Insufficient documentation

## 2015-06-19 ENCOUNTER — Emergency Department (HOSPITAL_COMMUNITY): Payer: Medicare Other

## 2015-06-19 ENCOUNTER — Observation Stay (HOSPITAL_COMMUNITY)
Admission: EM | Admit: 2015-06-19 | Discharge: 2015-06-21 | Disposition: A | Discharge status: Skilled Nursing Facility | Payer: Medicare Other | Attending: Internal Medicine | Admitting: Internal Medicine

## 2015-06-19 ENCOUNTER — Encounter (HOSPITAL_COMMUNITY): Payer: Self-pay

## 2015-06-19 DIAGNOSIS — S32415A Nondisplaced fracture of anterior wall of left acetabulum, initial encounter for closed fracture: Principal | ICD-10-CM | POA: Insufficient documentation

## 2015-06-19 DIAGNOSIS — I456 Pre-excitation syndrome: Secondary | ICD-10-CM | POA: Insufficient documentation

## 2015-06-19 DIAGNOSIS — I10 Essential (primary) hypertension: Secondary | ICD-10-CM | POA: Diagnosis not present

## 2015-06-19 DIAGNOSIS — Z7901 Long term (current) use of anticoagulants: Secondary | ICD-10-CM | POA: Diagnosis not present

## 2015-06-19 DIAGNOSIS — W1789XA Other fall from one level to another, initial encounter: Secondary | ICD-10-CM | POA: Insufficient documentation

## 2015-06-19 DIAGNOSIS — T148 Other injury of unspecified body region: Secondary | ICD-10-CM | POA: Diagnosis not present

## 2015-06-19 DIAGNOSIS — E039 Hypothyroidism, unspecified: Secondary | ICD-10-CM | POA: Diagnosis not present

## 2015-06-19 DIAGNOSIS — M25552 Pain in left hip: Secondary | ICD-10-CM | POA: Insufficient documentation

## 2015-06-19 DIAGNOSIS — Z8673 Personal history of transient ischemic attack (TIA), and cerebral infarction without residual deficits: Secondary | ICD-10-CM | POA: Diagnosis not present

## 2015-06-19 DIAGNOSIS — M109 Gout, unspecified: Secondary | ICD-10-CM | POA: Insufficient documentation

## 2015-06-19 DIAGNOSIS — Z9049 Acquired absence of other specified parts of digestive tract: Secondary | ICD-10-CM | POA: Diagnosis not present

## 2015-06-19 DIAGNOSIS — Z79899 Other long term (current) drug therapy: Secondary | ICD-10-CM | POA: Diagnosis not present

## 2015-06-19 DIAGNOSIS — E785 Hyperlipidemia, unspecified: Secondary | ICD-10-CM | POA: Diagnosis not present

## 2015-06-19 DIAGNOSIS — Y92129 Unspecified place in nursing home as the place of occurrence of the external cause: Secondary | ICD-10-CM | POA: Diagnosis not present

## 2015-06-19 DIAGNOSIS — I4891 Unspecified atrial fibrillation: Secondary | ICD-10-CM | POA: Diagnosis present

## 2015-06-19 DIAGNOSIS — I480 Paroxysmal atrial fibrillation: Secondary | ICD-10-CM | POA: Insufficient documentation

## 2015-06-19 DIAGNOSIS — S32475A Nondisplaced fracture of medial wall of left acetabulum, initial encounter for closed fracture: Secondary | ICD-10-CM | POA: Diagnosis not present

## 2015-06-19 DIAGNOSIS — S32414A Nondisplaced fracture of anterior wall of right acetabulum, initial encounter for closed fracture: Secondary | ICD-10-CM | POA: Diagnosis not present

## 2015-06-19 DIAGNOSIS — W19XXXA Unspecified fall, initial encounter: Secondary | ICD-10-CM

## 2015-06-19 DIAGNOSIS — Z7982 Long term (current) use of aspirin: Secondary | ICD-10-CM | POA: Diagnosis not present

## 2015-06-19 DIAGNOSIS — Z8551 Personal history of malignant neoplasm of bladder: Secondary | ICD-10-CM | POA: Insufficient documentation

## 2015-06-19 DIAGNOSIS — S32402A Unspecified fracture of left acetabulum, initial encounter for closed fracture: Secondary | ICD-10-CM | POA: Diagnosis present

## 2015-06-19 HISTORY — DX: Gout, unspecified: M10.9

## 2015-06-19 HISTORY — DX: Hypothyroidism, unspecified: E03.9

## 2015-06-19 HISTORY — DX: Hyperlipidemia, unspecified: E78.5

## 2015-06-19 HISTORY — DX: Unspecified atrial fibrillation: I48.91

## 2015-06-19 HISTORY — DX: Personal history of other diseases of the digestive system: Z87.19

## 2015-06-19 HISTORY — DX: Essential (primary) hypertension: I10

## 2015-06-19 HISTORY — DX: Malignant neoplasm of bladder, unspecified: C67.9

## 2015-06-19 HISTORY — DX: Cerebral infarction, unspecified: I63.9

## 2015-06-19 LAB — CBC WITH DIFFERENTIAL/PLATELET
Basophils Absolute: 0 10*3/uL (ref 0.0–0.1)
Basophils Relative: 0 %
EOS ABS: 0.4 10*3/uL (ref 0.0–0.7)
Eosinophils Relative: 4 %
HEMATOCRIT: 41.8 % (ref 39.0–52.0)
HEMOGLOBIN: 14.1 g/dL (ref 13.0–17.0)
LYMPHS ABS: 1.8 10*3/uL (ref 0.7–4.0)
Lymphocytes Relative: 18 %
MCH: 30.9 pg (ref 26.0–34.0)
MCHC: 33.7 g/dL (ref 30.0–36.0)
MCV: 91.7 fL (ref 78.0–100.0)
MONOS PCT: 10 %
Monocytes Absolute: 0.9 10*3/uL (ref 0.1–1.0)
NEUTROS ABS: 6.6 10*3/uL (ref 1.7–7.7)
Neutrophils Relative %: 68 %
Platelets: 183 10*3/uL (ref 150–400)
RBC: 4.56 MIL/uL (ref 4.22–5.81)
RDW: 15.1 % (ref 11.5–15.5)
WBC: 9.7 10*3/uL (ref 4.0–10.5)

## 2015-06-19 LAB — URINALYSIS, ROUTINE W REFLEX MICROSCOPIC
Bilirubin Urine: NEGATIVE
Glucose, UA: NEGATIVE mg/dL
Hgb urine dipstick: NEGATIVE
KETONES UR: NEGATIVE mg/dL
LEUKOCYTES UA: NEGATIVE
NITRITE: NEGATIVE
PROTEIN: NEGATIVE mg/dL
Specific Gravity, Urine: 1.014 (ref 1.005–1.030)
pH: 6 (ref 5.0–8.0)

## 2015-06-19 LAB — BASIC METABOLIC PANEL
Anion gap: 6 (ref 5–15)
BUN: 24 mg/dL — AB (ref 6–20)
CHLORIDE: 110 mmol/L (ref 101–111)
CO2: 25 mmol/L (ref 22–32)
Calcium: 9 mg/dL (ref 8.9–10.3)
Creatinine, Ser: 1.17 mg/dL (ref 0.61–1.24)
GFR calc non Af Amer: 55 mL/min — ABNORMAL LOW (ref >60)
Glucose, Bld: 107 mg/dL — ABNORMAL HIGH (ref 65–99)
POTASSIUM: 4 mmol/L (ref 3.5–5.1)
Sodium: 141 mmol/L (ref 135–145)

## 2015-06-19 LAB — PROTIME-INR
INR: 1.17 (ref 0.00–1.49)
PROTHROMBIN TIME: 14.6 s (ref 11.6–15.2)

## 2015-06-19 MED ORDER — CHOLECALCIFEROL 10 MCG (400 UNIT) PO TABS
400.0000 [IU] | ORAL_TABLET | Freq: Every day | ORAL | Status: DC
  Administered 2015-06-20 – 2015-06-21 (×2): 400 [IU] via ORAL
  Filled 2015-06-19 (×2): qty 1

## 2015-06-19 MED ORDER — LOSARTAN POTASSIUM 50 MG PO TABS: 50.0000 mg | ORAL_TABLET | Freq: Every day | ORAL | Status: DC

## 2015-06-19 MED ORDER — APIXABAN 2.5 MG PO TABS
2.5000 mg | ORAL_TABLET | Freq: Every day | ORAL | Status: DC
  Administered 2015-06-20: 2.5 mg via ORAL
  Filled 2015-06-19: qty 1

## 2015-06-19 MED ORDER — DILTIAZEM HCL ER 240 MG PO CP24
240.0000 mg | ORAL_CAPSULE | Freq: Every day | ORAL | Status: DC
  Administered 2015-06-20 – 2015-06-21 (×2): 240 mg via ORAL
  Filled 2015-06-19 (×2): qty 1

## 2015-06-19 MED ORDER — HYDROCODONE-ACETAMINOPHEN 5-325 MG PO TABS
2.0000 | ORAL_TABLET | Freq: Once | ORAL | Status: AC
  Administered 2015-06-19: 2 via ORAL
  Filled 2015-06-19: qty 2

## 2015-06-19 MED ORDER — APIXABAN 2.5 MG PO TABS: 2.5000 mg | ORAL_TABLET | Freq: Every day | ORAL | Status: DC

## 2015-06-19 MED ORDER — ASPIRIN EC 81 MG PO TBEC
81.0000 mg | DELAYED_RELEASE_TABLET | Freq: Every day | ORAL | Status: DC
  Administered 2015-06-20 – 2015-06-21 (×2): 81 mg via ORAL
  Filled 2015-06-19 (×2): qty 1

## 2015-06-19 MED ORDER — ATORVASTATIN CALCIUM 40 MG PO TABS
40.0000 mg | ORAL_TABLET | Freq: Every day | ORAL | Status: DC
  Administered 2015-06-20 – 2015-06-21 (×2): 40 mg via ORAL
  Filled 2015-06-19 (×2): qty 1

## 2015-06-19 MED ORDER — ALLOPURINOL 300 MG PO TABS
300.0000 mg | ORAL_TABLET | Freq: Every day | ORAL | Status: DC
  Administered 2015-06-20 – 2015-06-21 (×2): 300 mg via ORAL
  Filled 2015-06-19 (×2): qty 1

## 2015-06-19 MED ORDER — HYDROCODONE-ACETAMINOPHEN 5-325 MG PO TABS: 1.0000 | ORAL_TABLET | Freq: Four times a day (QID) | ORAL | Status: DC | PRN

## 2015-06-19 MED ORDER — ASPIRIN EC 81 MG PO TBEC: 81.0000 mg | DELAYED_RELEASE_TABLET | Freq: Every day | ORAL | Status: DC

## 2015-06-19 MED ORDER — LOSARTAN POTASSIUM 50 MG PO TABS
50.0000 mg | ORAL_TABLET | Freq: Every day | ORAL | Status: DC
  Administered 2015-06-20 – 2015-06-21 (×2): 50 mg via ORAL
  Filled 2015-06-19 (×2): qty 1

## 2015-06-19 MED ORDER — LEVOTHYROXINE SODIUM 125 MCG PO TABS
125.0000 ug | ORAL_TABLET | Freq: Every day | ORAL | Status: DC
  Administered 2015-06-21: 125 ug via ORAL
  Filled 2015-06-19 (×2): qty 1

## 2015-06-19 MED ORDER — MORPHINE SULFATE (PF) 2 MG/ML IV SOLN
0.5000 mg | INTRAVENOUS | Status: DC | PRN
Start: 2015-06-19 — End: 2015-06-21

## 2015-06-19 MED ORDER — ALLOPURINOL 300 MG PO TABS: 300.0000 mg | ORAL_TABLET | Freq: Every day | ORAL | Status: DC

## 2015-06-19 MED ORDER — DILTIAZEM HCL ER 180 MG PO CP24: 180.0000 mg | ORAL_CAPSULE | Freq: Every day | ORAL | Status: DC

## 2015-06-19 MED ORDER — LEVOTHYROXINE SODIUM 125 MCG PO TABS: 125.0000 ug | ORAL_TABLET | Freq: Every day | ORAL | Status: DC

## 2015-06-19 NOTE — ED Notes (Signed)
Asked for urine. Pt informed he has a room

## 2015-06-19 NOTE — H&P (Signed)
History and Physical    Samuel Shaffer [id number] DOB: 31-Dec-1926 DOA: 06/19/2015  Referring MD/NP/PA: Dr. Jeneen Rinks PCP: [name], MD Outpatient Specialists: None Patient coming from: Home  Chief Complaint: L hip pain  HPI: Samuel Shaffer is a 82 y.o. male with medical history significant of HTN, gout, Paroxysmal A.Fib, patient presents to the ED with anterior left hip pain.  Symptoms onset this morning at 0400.  Patient reports a fall (mechanical, no syncope or LOC) at around 1830 last night, got up without difficulty and went to bed, symptoms onset this morning.  Pain is 2/10 throbbing at rest, 10/10 sharp with weight bearing.  Moderate pain with movement.   ED Course: L acetabular fracture found on MRI.  Review of Systems: As per HPI otherwise 10 point review of systems negative.    Past Medical History  Diagnosis Date  . Hypertension   . Hyperlipidemia   . Hypothyroidism   . Gout   . Stroke ([place])   . A-fib ([place])   . Bladder cancer [facility])     s/p resection  . History of GI diverticular bleed     Past Surgical History  Procedure Laterality Date  . Knee surgery    . Cholecystectomy       reports that he has never smoked. He does not have any smokeless tobacco history on file. He reports that he drinks about 0.6 oz of alcohol per week. His drug history is not on file.  No Known Allergies  History reviewed. No pertinent family history.   Prior to Admission medications   Medication Sig Start Date End Date Taking? Authorizing Provider  allopurinol (ZYLOPRIM) 300 MG tablet Take 300 mg by mouth daily. 05/31/15  Yes Historical Provider, MD  aspirin EC 81 MG tablet Take 81 mg by mouth daily.   Yes Historical Provider, MD  atorvastatin (LIPITOR) 40 MG tablet Take 40 mg by mouth daily.   Yes Historical Provider, MD  diltiazem (DILT-XR) 180 MG 24 hr capsule Take 180 mg by mouth daily. 05/16/12  Yes Historical Provider, MD  ELIQUIS 2.5 MG TABS tablet Take 2.5  mg by mouth daily. 05/27/15  Yes Historical Provider, MD  levothyroxine (SYNTHROID) 125 MCG tablet Take 125 mcg by mouth daily. 06/16/12  Yes Historical Provider, MD  losartan (COZAAR) 50 MG tablet Take 50 mg by mouth daily. 05/31/15  Yes Historical Provider, MD  Vitamin D, Cholecalciferol, 400 units CAPS Take 400 Units by mouth daily.   Yes Historical Provider, MD    Physical Exam: Filed Vitals:   06/19/15 1449 06/19/15 1739 06/19/15 1938  BP: 137/79 129/75 132/87  Pulse: 90 72 74  Temp: 97.7 F (36.5 C)  98.1 F (36.7 C)  TempSrc: Oral  Oral  Resp: 18 18 19   SpO2: 92% 91% 87%      Constitutional: NAD, calm, comfortable Filed Vitals:   06/19/15 1449 06/19/15 1739 06/19/15 1938  BP: 137/79 129/75 132/87  Pulse: 90 72 74  Temp: 97.7 F (36.5 C)  98.1 F (36.7 C)  TempSrc: Oral  Oral  Resp: 18 18 19   SpO2: 92% 91% 87%   Eyes: PERRL, lids and conjunctivae normal ENMT: Mucous membranes are moist. Posterior pharynx clear of any exudate or lesions.Normal dentition.  Neck: normal, supple, no masses, no thyromegaly Respiratory: clear to auscultation bilaterally, no wheezing, no crackles. Normal respiratory effort. No accessory muscle use.  Cardiovascular: Regular rate and rhythm, no murmurs / rubs / gallops. No extremity edema. 2+ pedal pulses.  No carotid bruits.  Abdomen: no tenderness, no masses palpated. No hepatosplenomegaly. Bowel sounds positive.  Musculoskeletal: no clubbing / cyanosis. No joint deformity upper and lower extremities. Good ROM, no contractures. Normal muscle tone.  Skin: no rashes, lesions, ulcers. No induration Neurologic: CN 2-12 grossly intact. Sensation intact, DTR normal. Strength 5/5 in all 4.  Psychiatric: Normal judgment and insight. Alert and oriented x 3. Normal mood.    Labs on Admission: I have personally reviewed following labs and imaging studies  CBC:  Recent Labs Lab 06/19/15 2009  WBC 9.7  NEUTROABS 6.6  HGB 14.1  HCT 41.8  MCV 91.7    PLT [id number]   Basic Metabolic Panel:  Recent Labs Lab 06/19/15 2009  NA 141  K 4.0  CL 110  CO2 25  GLUCOSE 107*  BUN 24*  CREATININE 1.17  CALCIUM 9.0   GFR: CrCl cannot be calculated (Unknown ideal weight.). Liver Function Tests: No results for input(s): AST, ALT, ALKPHOS, BILITOT, PROT, ALBUMIN in the last 168 hours. No results for input(s): LIPASE, AMYLASE in the last 168 hours. No results for input(s): AMMONIA in the last 168 hours. Coagulation Profile:  Recent Labs Lab 06/19/15 2009  INR 1.17   Cardiac Enzymes: No results for input(s): CKTOTAL, CKMB, CKMBINDEX, TROPONINI in the last 168 hours. BNP (last 3 results) No results for input(s): PROBNP in the last 8760 hours. HbA1C: No results for input(s): HGBA1C in the last 72 hours. CBG: No results for input(s): GLUCAP in the last 168 hours. Lipid Profile: No results for input(s): CHOL, HDL, LDLCALC, TRIG, CHOLHDL, LDLDIRECT in the last 72 hours. Thyroid Function Tests: No results for input(s): TSH, T4TOTAL, FREET4, T3FREE, THYROIDAB in the last 72 hours. Anemia Panel: No results for input(s): VITAMINB12, FOLATE, FERRITIN, TIBC, IRON, RETICCTPCT in the last 72 hours. Urine analysis: No results found for: COLORURINE, APPEARANCEUR, LABSPEC, PHURINE, GLUCOSEU, HGBUR, BILIRUBINUR, KETONESUR, PROTEINUR, UROBILINOGEN, NITRITE, LEUKOCYTESUR Sepsis Labs: @LABRCNTIP (procalcitonin:4,lacticidven:4) )No results found for this or any previous visit (from the past 240 hour(s)).   Radiological Exams on Admission: Dg Chest Port 1 View  06/19/2015  CLINICAL DATA:  Nondisplaced acetabular fracture EXAM: PORTABLE CHEST 1 VIEW COMPARISON:  MRI 06/19/2015 FINDINGS: Normal cardiac silhouette. Chronic appearing interstitial markings in the lungs. No pulmonary edema. No new infiltrate pneumothorax. Degenerate spurring of the spine. IMPRESSION: Chronic interstitial markings.  No acute findings. Electronically Signed   By: Suzy Bouchard  M.D.   On: 06/19/2015 20:34   Dg Hip Unilat With Pelvis 2-3 Views Left  06/19/2015  CLINICAL DATA:  Pain in LEFT hip after fall last night. EXAM: DG HIP (WITH OR WITHOUT PELVIS) 2-3V LEFT COMPARISON:  03/25/2015 FINDINGS: Hips are located. There is joint space narrowing the LEFT and RIGHT hip joint. Dedicated view of the LEFT hip demonstrates no fracture dislocation. IMPRESSION: 1. No evidence of fracture dislocation of the LEFT hip. 2. Bilateral osteoarthritis of the joints. Electronically Signed   By: Suzy Bouchard M.D.   On: 06/19/2015 16:27    EKG: Independently reviewed.  Assessment/Plan Principal Problem:   Closed left acetabular fracture (HCC) Active Problems:   A-fib (HCC)   Wolff-Parkinson-White (WPW) pattern   Left acetabular fracture, closed, initial encounter  Closed left acetabular fracture -   Non surgical per ortho  Patient NWB till they eval further  Hip Fx pathway  Care management and SW for likely Rehab placement  H/o A.Fib - continue home meds, rate control and eliquis  WPW pattern on EKG - despite  a.Fib history and seeing cardiology in past, patient dosent recall anything being said about this.  No cardiology notes in chart care-everywhere that specifically mention it.   DVT prophylaxis: Eliquis Code Status: Full Family Communication: No family in room Consults called: Admit to inpatient Admission status: 80 min   Jessilynn Taft, [place] [facility] Pager [phone] from 7PM-7AM  If 7AM-7PM, please contact the day physician for the patient [email or web address] Password [facility]  06/19/2015, 8:50 PM

## 2015-06-19 NOTE — ED Notes (Signed)
Bed: [id number] Expected date:  Expected time:  Means of arrival:  Comments: Ems M possible hip fracture

## 2015-06-19 NOTE — ED Provider Notes (Signed)
Pt seen and evaluated.  D/W PA.  Pt with fall and hip pain, only with standing/ambulation.  Able to tolerate ROM of LLE supine.  Pain with attempted Weight bearing.  Plain film Lt Hip x-rays negative.  MRI + acetabular fracture   Consulted Dr. Alphonzo Severance, Orthopedist. Will see patient in consult.  Will discuss admit with [facility].  Tanna Furry, MD 06/19/15 [phone]

## 2015-06-19 NOTE — ED Notes (Signed)
He states he tripped and fell yesterday evening (didn't pass out); and didn't have any complaint at the time.  He is here today with c/o left hip pain, which he noted upon arising this morning and persists.  He is alert and oriented x 4 with clear speech.  CMS intact all toes bilat.  He c/o pain at ant. Hip area.

## 2015-06-19 NOTE — Consult Note (Signed)
Patient ID: Samuel Shaffer MRN: [id number] DOB/AGE: 05-30-1926 82 y.o.  Admit date: 06/19/2015  Admission Diagnoses:  Left nondisplaced acetabulum fracture Hypertension Hyperlipidemia Hypothyroidism  HPI: Patient being consulted for left nondisplaced acetabulum fracture.  Patient lives at [place] home and states that last night he fell out of his buggy and landed onto left hip.  Had ambulated after this but in the early AM he woke up with severe pain. Increased with ambulating.  No other injuries.  No previous problems with hip before fall.    Past Medical History: Past Medical History  Diagnosis Date  . Hypertension   . Hyperlipidemia   . Hypothyroidism   . Gout     Surgical History: Past Surgical History  Procedure Laterality Date  . Knee surgery    . Cholecystectomy      Family History: History reviewed. No pertinent family history.  Social History: Social History   Social History  . Marital Status: Single    Spouse Name: N/A  . Number of Children: N/A  . Years of Education: N/A   Occupational History  . Not on file.   Social History Main Topics  . Smoking status: Never Smoker   . Smokeless tobacco: Not on file  . Alcohol Use: 0.6 oz/week    1 Glasses of wine per week     Comment: 1 glass of wine every Fri. evening.  . Drug Use: Not on file  . Sexual Activity: Not on file   Other Topics Concern  . Not on file   Social History Narrative  . No narrative on file    Allergies: Review of patient's allergies indicates no known allergies.  Medications: I have reviewed the patient's current medications.  Vital Signs: Patient Vitals for the past 24 hrs:  BP Temp Temp src Pulse Resp SpO2  06/19/15 1938 132/87 mmHg 98.1 F (36.7 C) Oral 74 19 (!) 87 %  06/19/15 1739 129/75 mmHg - - 72 18 91 %  06/19/15 1449 137/79 mmHg 97.7 F (36.5 C) Oral 90 18 92 %    Radiology: US Aorta  06/01/2015  CLINICAL DATA:  Abdominal aortic aneurysm. EXAM:  ULTRASOUND OF ABDOMINAL AORTA TECHNIQUE: Ultrasound examination of the abdominal aorta was performed to evaluate for abdominal aortic aneurysm. COMPARISON:  None. FINDINGS: Abdominal Aorta 3.0 cm abdominal aortic aneurysm is noted. Maximum Diameter: 3.0 cm IMPRESSION: 3.0 cm abdominal aortic aneurysm. Recommend followup by ultrasound in 3 years. This recommendation follows ACR consensus guidelines: White Paper of the ACR Incidental Findings Committee II on Vascular Findings. Joellyn Rued Radiol 2013; [id number] Electronically Signed   By: Marcello Moores  Register   On: 06/01/2015 11:05   Dg Hip Unilat With Pelvis 2-3 Views Left  06/19/2015  CLINICAL DATA:  Pain in LEFT hip after fall last night. EXAM: DG HIP (WITH OR WITHOUT PELVIS) 2-3V LEFT COMPARISON:  03/25/2015 FINDINGS: Hips are located. There is joint space narrowing the LEFT and RIGHT hip joint. Dedicated view of the LEFT hip demonstrates no fracture dislocation. IMPRESSION: 1. No evidence of fracture dislocation of the LEFT hip. 2. Bilateral osteoarthritis of the joints. Electronically Signed   By: Suzy Bouchard M.D.   On: 06/19/2015 16:27    Labs: No results for input(s): WBC, RBC, HCT, PLT in the last 72 hours. No results for input(s): NA, K, CL, CO2, BUN, CREATININE, GLUCOSE, CALCIUM in the last 72 hours. No results for input(s): LABPT, INR in the last 72 hours.  Review of Systems: Review  of Systems  Constitutional: Negative.   HENT: Negative.   Cardiovascular: Negative.   Gastrointestinal: Negative.   Genitourinary: Negative.   Musculoskeletal: Positive for joint pain.  Neurological: Negative.   Psychiatric/Behavioral: Negative.     Physical Exam: Neurologically intact ABD soft Sensation intact distally Intact pulses distally Dorsiflexion/Plantar flexion intact Compartment soft Extremely pleasant elderly male, alert and oriented x 3, in NAD.  No hip pain with log roll.  bilat calves nontender.   Head is normocephalic,  atraumatic No respiratory distress.  Assessment and Plan: Nondisplaced left acetabulum fracture Will treat this conservatively.  NWB with walker.  Ok to have regular diet.  My attending Dr Marlou Sa has reviewed imaging studies and discussed treatment plan.    Alyson Locket. Ricard Dillon  for Dr Anderson Malta [facility] orthopedics [phone]

## 2015-06-19 NOTE — ED Provider Notes (Signed)
CSN: [id number]     Arrival date & time 06/19/15  1428 History   First MD Initiated Contact with Patient 06/19/15 1506     Chief Complaint  Patient presents with  . Fall  . Hip Pain     (Consider location/radiation/quality/duration/timing/severity/associated sxs/prior Treatment) Patient is a 82 y.o. male presenting with fall and hip pain. The history is provided by the patient.  Fall Associated symptoms include arthralgias. Pertinent negatives include no abdominal pain, chest pain, chills, fever, joint swelling, numbness, rash or weakness.  Hip Pain Associated symptoms include arthralgias. Pertinent negatives include no abdominal pain, chest pain, chills, fever, joint swelling, numbness, rash or weakness.     Patient is a 82 y/o male with a PMHx of HTN, hypothyroidism, GOUT, bladder cancer who presents to the ED with anterior left hip pain that began this morning at 0400. He states last night around 1830 he fell out of his buggie and landed on his left hip/buttock. He got up without difficultly or pain and returned to his chair. No pain last night. He denies LOC, dizziness or hitting his head. No other injuries. He states the pain as 2/10 throbbing at rest and 10/10 sharp with bearing weight. Better at rest and moderate pain with movement. He denies numbness/tingling of his left leg and endorses weakness due to pain. He denies blood in his stool or urine, saddle anesthesia, back pain, loss of bowel or bladder function. Pt had a fall in January 2017 in which xrays of left hip were taken with no acute fracture noted.   Past Medical History  Diagnosis Date  . Hypertension   . Hyperlipidemia   . Hypothyroidism   . Gout   . Stroke ([place])   . A-fib ([place])   . Bladder cancer [facility])     s/p resection  . History of GI diverticular bleed    Past Surgical History  Procedure Laterality Date  . Knee surgery    . Cholecystectomy     History reviewed. No pertinent family history. Social History   Substance Use Topics  . Smoking status: Never Smoker   . Smokeless tobacco: None  . Alcohol Use: 0.6 oz/week    1 Glasses of wine per week     Comment: 1 glass of wine every Fri. evening.    Review of Systems  Constitutional: Negative for fever and chills.  Respiratory: Negative for shortness of breath.   Cardiovascular: Negative for chest pain and leg swelling.  Gastrointestinal: Negative for abdominal pain and blood in stool.  Genitourinary: Negative for hematuria.  Musculoskeletal: Positive for arthralgias. Negative for back pain and joint swelling.  Skin: Negative for rash.  Neurological: Negative for dizziness, syncope, weakness, light-headedness and numbness.  Hematological: Does not bruise/bleed easily.  Psychiatric/Behavioral: Negative for confusion.  All other systems reviewed and are negative.     Allergies  Review of patient's allergies indicates no known allergies.  Home Medications   Prior to Admission medications   Medication Sig Start Date End Date Taking? Authorizing Provider  allopurinol (ZYLOPRIM) 300 MG tablet Take 300 mg by mouth daily. 05/31/15  Yes Historical Provider, MD  aspirin EC 81 MG tablet Take 81 mg by mouth daily.   Yes Historical Provider, MD  atorvastatin (LIPITOR) 40 MG tablet Take 40 mg by mouth daily.   Yes Historical Provider, MD  diltiazem (DILACOR XR) 240 MG 24 hr capsule Take 240 mg by mouth daily.   Yes Historical Provider, MD  ELIQUIS 2.5 MG TABS tablet  Take 2.5 mg by mouth 2 (two) times daily.  05/27/15  Yes Historical Provider, MD  levothyroxine (SYNTHROID) 125 MCG tablet Take 125 mcg by mouth daily. 06/16/12  Yes Historical Provider, MD  losartan (COZAAR) 50 MG tablet Take 50 mg by mouth daily. 05/31/15  Yes Historical Provider, MD  Vitamin D, Cholecalciferol, 400 units CAPS Take 400 Units by mouth daily.   Yes Historical Provider, MD   BP 137/79 mmHg  Pulse 90  Temp(Src) 97.7 F (36.5 C) (Oral)  Resp 18  SpO2 92% Physical Exam   Constitutional: He appears well-developed and well-nourished. No distress.  HENT:  Head: Normocephalic and atraumatic.  Eyes: Conjunctivae are normal.  Cardiovascular: Normal rate, regular rhythm and normal heart sounds.   Pulses:      Dorsalis pedis pulses are 2+ on the right side, and 2+ on the left side.  Pulmonary/Chest: Effort normal and breath sounds normal. He has no wheezes. He has no rales.  Abdominal: Soft. There is no tenderness.  Musculoskeletal:  Small area of echymosis of lateral left hip, no abrasions or lacerations, good ROM and mild pain with flexion and extension of hip, no pain with internal or external rotation, TTP over the greater trochancer, no TTP of lumbar spine or of iliac crest  Neurological: He is alert. Coordination normal.  Skin: He is not diaphoretic.    ED Course  Procedures (including critical care time) Labs Review Labs Reviewed  BASIC METABOLIC PANEL - Abnormal; Notable for the following:    Glucose, Bld 107 (*)    BUN 24 (*)    GFR calc non Af Amer 55 (*)    All other components within normal limits  URINE CULTURE  CBC WITH DIFFERENTIAL/PLATELET  PROTIME-INR  URINALYSIS, ROUTINE W REFLEX MICROSCOPIC (NOT AT [facility])    Imaging Review Dg Chest Port 1 View  06/19/2015  CLINICAL DATA:  Nondisplaced acetabular fracture EXAM: PORTABLE CHEST 1 VIEW COMPARISON:  MRI 06/19/2015 FINDINGS: Normal cardiac silhouette. Chronic appearing interstitial markings in the lungs. No pulmonary edema. No new infiltrate pneumothorax. Degenerate spurring of the spine. IMPRESSION: Chronic interstitial markings.  No acute findings. Electronically Signed   By: Suzy Bouchard M.D.   On: 06/19/2015 20:34   Dg Hip Unilat With Pelvis 2-3 Views Left  06/19/2015  CLINICAL DATA:  Pain in LEFT hip after fall last night. EXAM: DG HIP (WITH OR WITHOUT PELVIS) 2-3V LEFT COMPARISON:  03/25/2015 FINDINGS: Hips are located. There is joint space narrowing the LEFT and RIGHT hip joint.  Dedicated view of the LEFT hip demonstrates no fracture dislocation. IMPRESSION: 1. No evidence of fracture dislocation of the LEFT hip. 2. Bilateral osteoarthritis of the joints. Electronically Signed   By: Suzy Bouchard M.D.   On: 06/19/2015 16:27   I have personally reviewed and evaluated these images and lab results as part of my medical decision-making.   EKG Interpretation None      MDM   Final diagnoses:  Fall    Patient with left hip pain after fall last evening. I reviewed the xrays which revealed osteoarthritis without evidence of fracture or dislocation. No neurological deficits.  Patient cannot stand unassisted due to pain.  MR ordered to rule out fracture. Labs unremarkable. Pain controlled in the ED.  Radiologist called and spoke with Dr. Jeneen Rinks and per the MR the pt has a left acetabular fracture. Dr. Jeneen Rinks saw the patient and will admit him to the hospitalist and ortho will follow up with the patient.  Kalman Drape, PA 06/20/15 0145  Tanna Furry, MD 07/02/15 [phone]

## 2015-06-19 NOTE — ED Notes (Signed)
20 min timer started.  8:56am

## 2015-06-19 NOTE — ED Notes (Signed)
He is in m.r.i. As I write this.

## 2015-06-20 ENCOUNTER — Encounter (HOSPITAL_COMMUNITY): Payer: Self-pay | Admitting: *Deleted

## 2015-06-20 DIAGNOSIS — I10 Essential (primary) hypertension: Secondary | ICD-10-CM | POA: Diagnosis not present

## 2015-06-20 DIAGNOSIS — I480 Paroxysmal atrial fibrillation: Secondary | ICD-10-CM

## 2015-06-20 DIAGNOSIS — E039 Hypothyroidism, unspecified: Secondary | ICD-10-CM

## 2015-06-20 DIAGNOSIS — E785 Hyperlipidemia, unspecified: Secondary | ICD-10-CM

## 2015-06-20 DIAGNOSIS — S32412A Displaced fracture of anterior wall of left acetabulum, initial encounter for closed fracture: Secondary | ICD-10-CM | POA: Diagnosis not present

## 2015-06-20 DIAGNOSIS — S32402D Unspecified fracture of left acetabulum, subsequent encounter for fracture with routine healing: Secondary | ICD-10-CM

## 2015-06-20 MED ORDER — HYDROCODONE-ACETAMINOPHEN 5-325 MG PO TABS: 1.0000 | ORAL_TABLET | Freq: Four times a day (QID) | ORAL | Status: DC | PRN

## 2015-06-20 MED ORDER — APIXABAN 2.5 MG PO TABS
2.5000 mg | ORAL_TABLET | Freq: Two times a day (BID) | ORAL | Status: DC
  Administered 2015-06-20 – 2015-06-21 (×2): 2.5 mg via ORAL
  Filled 2015-06-20 (×3): qty 1

## 2015-06-20 NOTE — Progress Notes (Addendum)
Nondisplaced left acetabular fracture in an ambulatory 82 year old patient  Mild groin pain with internal/external rotation of the left leg no other issues with upper or lower extremity injuries  MRI confirms nondisplaced nature of the acetabular fracture  Plan nonweightbearing for 3 weeks repeat clinical assessment in 3 weeks at my office will likely start weightbearing at that time  On aspirin and eliquis already.  Will add Norco as prescription for pain medicines for discharge - in chart

## 2015-06-20 NOTE — Discharge Instructions (Signed)
Nonweightbearing left leg for 3 weeks from 06/20/2015

## 2015-06-20 NOTE — Evaluation (Signed)
Physical Therapy Evaluation Patient Details Name: Samuel Shaffer MRN: [id number] DOB: 09-25-1926 Today's Date: 06/20/2015   History of Present Illness  83 yo male adm after fall resulting in   L acetabular fx; per ortho pt to be managed non-surgically;medical history: HTN, gout, Paroxysmal A.Fib  Clinical Impression  Pt admitted with above diagnosis. Pt currently with functional limitations due to the deficits listed below (see PT Problem List).  Pt will benefit from skilled PT to increase their independence and safety with mobility to allow discharge to the venue listed below.   Pt +2 mod for bed mobility and transfers, pt unable to maintain NWB; will continue to follow     Follow Up Recommendations SNF    Equipment Recommendations       Recommendations for Other Services       Precautions / Restrictions Precautions Precautions: Fall Restrictions Weight Bearing Restrictions: Yes LLE Weight Bearing: Non weight bearing      Mobility  Bed Mobility Overal bed mobility: Needs Assistance Bed Mobility: Supine to Sit     Supine to sit: Mod assist     General bed mobility comments: incr time, pt uses momentum to come to full sit;  multi-modal cues for technique and self assist  Transfers Overall transfer level: Needs assistance Equipment used: Rolling walker (2 wheeled) Transfers: Sit to/from [facility] Sit to Stand: Mod assist;+2 physical assistance Stand pivot transfers: +2 physical assistance;Mod assist       General transfer comment: cues for hand placement, wt shift, NWB; assist to rise and control descent as well as to maintain NWB  Ambulation/Gait   Ambulation Distance (Feet):  (pivotal steps only, pt unable to maintain NWB)            Stairs            Wheelchair Mobility    Modified Rankin (Stroke Patients Only)       Balance Overall balance assessment: Needs assistance;History of Falls   Sitting balance-Leahy Scale:  Poor Sitting balance - Comments: able to briefly  maintain static sit with cues and incr time Postural control: Posterior lean   Standing balance-Leahy Scale: Zero                               Pertinent Vitals/Pain Pain Assessment: No/denies pain    Home Living Family/patient expects to be discharged to:: Skilled nursing facility Living Arrangements: Other (Comment)               Additional Comments: pt resides in ALF apt at [facility]    Prior Function Level of Independence: Independent with assistive device(s);Independent         Comments: pt uses cane in room; motorized scooter for longer distances/going to DR etc.; pt reports I ADLS     Hand Dominance        Extremity/Trunk Assessment   Upper Extremity Assessment: Overall WFL for tasks assessed           Lower Extremity Assessment: Generalized weakness   LLE Deficits / Details: AAROM grossly WFL, strength grossly 3/5     Communication      Cognition Arousal/Alertness: Awake/alert Behavior During Therapy: WFL for tasks assessed/performed Overall Cognitive Status: Within Functional Limits for tasks assessed                      General Comments      Exercises  Assessment/Plan    PT Assessment Patient needs continued PT services  PT Diagnosis Difficulty walking;Generalized weakness   PT Problem List    PT Treatment Interventions Gait training;DME instruction;Functional mobility training;Therapeutic activities;Therapeutic exercise;Patient/family education;Balance training   PT Goals (Current goals can be found in the Care Plan section) Acute Rehab PT Goals Patient Stated Goal: to get better PT Goal Formulation: With patient Time For Goal Achievement: 06/25/15 Potential to Achieve Goals: Good    Frequency Min 3X/week   Barriers to discharge        Co-evaluation               End of Session Equipment Utilized During Treatment: Gait belt Activity  Tolerance: Patient tolerated treatment well;Patient limited by fatigue Patient left: in chair;with call bell/phone within reach;with chair alarm set;with family/visitor present           Time: 1049-1103 PT Time Calculation (min) (ACUTE ONLY): 14 min   Charges:   PT Evaluation $PT Eval Moderate Complexity: 1 Procedure     PT G Codes:        Aizlyn Schifano 2015/06/27, 11:19 AM

## 2015-06-20 NOTE — Progress Notes (Signed)
PROGRESS NOTE  Samuel Shaffer  [id number] DOB: 05/14/1926  DOA: 06/19/2015 PCP: [name], MD  Outpatient Specialists:  Not known.  Brief Narrative:  82 year old male with history of HTN, HLD, hypothyroid, gout, stroke, PAF, bladder cancer status post resection, presented to ED with complaints of anterior left hip pain that started on the morning of admission after reported mechanical fall without syncope on the night prior. Imaging confirmed left acetabular fracture. Orthopedics consulted and recommended nonweightbearing on left lower extremity 3 weeks and outpatient follow-up.   Assessment & Plan:   Principal Problem:   Closed left acetabular fracture (HCC) Active Problems:   Hyperlipidemia   Hypothyroidism   A-fib (HCC)   Wolff-Parkinson-White (WPW) pattern   Essential hypertension   Nondisplaced left acetabular fracture - Sustained status post mechanical fall. - This did not show up on plain x-ray but revealed on MRI of the left hip. - Orthopedic consultation appreciated. Nonweightbearing 3 weeks followed by repeat clinical assessment in orthopedic office at which time we'll likely start weightbearing. - PT evaluation appreciated: SNF. DC when bed available.  Hyperlipidemia - Continue atorvastatin  Hypothyroid - Continue levothyroxin  Paroxysmal A. Fib - Seems to be in sinus rhythm currently. Continue diltiazem and Eliquis. Also on aspirin 81 MG daily-? Indication/increases risk for bleeding-follow-up with PCP or cardiology to consider discontinuing.  ???WPW - Not convinced of this on admission EKG.  Essential hypertension - Controlled. Continue losartan and diltiazem CD.  Gout - No acute issues. Continue allopurinol.   DVT prophylaxis: On Eliquis anticoagulation. Code Status: Full Family Communication: None at bedside Disposition Plan: DC to SNF possibly 4/24   Consultants:   Orthopedics  Procedures:   None  Antimicrobials:    None    Subjective: Minimal left hip pain even when standing up with assistance this morning. Denies any other complaints.  Objective:  Filed Vitals:   06/19/15 2115 06/19/15 2230 06/20/15 0544 06/20/15 0805  BP:  114/68 135/65   Pulse:  73    Temp:  98.5 F (36.9 C) 97.9 F (36.6 C)   TempSrc:   Oral   Resp: 18 18 18    Height:    5\' 11"  (1.803 m)  Weight:    78.472 kg (173 lb)  SpO2:  98% 95%     Intake/Output Summary (Last 24 hours) at 06/20/15 1502 Last data filed at 06/20/15 0805  Gross per 24 hour  Intake    480 ml  Output   1525 ml  Net  -1045 ml   Filed Weights   06/20/15 0805  Weight: 78.472 kg (173 lb)    Examination:  General exam: Pleasant elderly male lying comfortably supine in bed. Appears calm and comfortable  Respiratory system: Clear to auscultation. Respiratory effort normal. Cardiovascular system: S1 & S2 heard, RRR. No JVD, murmurs, rubs, gallops or clicks. No pedal edema. Gastrointestinal system: Abdomen is nondistended, soft and nontender. No organomegaly or masses felt. Normal bowel sounds heard. Central nervous system: Alert and oriented 2. No focal neurological deficits. Extremities: Symmetric 5 x 5 power. Skin: No rashes, lesions or ulcers Psychiatry: Judgement and insight appear normal. Mood & affect appropriate.     Data Reviewed: I have personally reviewed following labs and imaging studies  CBC:  Recent Labs Lab 06/19/15 2009  WBC 9.7  NEUTROABS 6.6  HGB 14.1  HCT 41.8  MCV 91.7  PLT [id number]   Basic Metabolic Panel:  Recent Labs Lab 06/19/15 2009  NA 141  K 4.0  CL  110  CO2 25  GLUCOSE 107*  BUN 24*  CREATININE 1.17  CALCIUM 9.0   GFR: Estimated Creatinine Clearance: 48.3 mL/min (by C-G formula based on Cr of 1.17). Liver Function Tests: No results for input(s): AST, ALT, ALKPHOS, BILITOT, PROT, ALBUMIN in the last 168 hours. No results for input(s): LIPASE, AMYLASE in the last 168 hours. No results for  input(s): AMMONIA in the last 168 hours. Coagulation Profile:  Recent Labs Lab 06/19/15 2009  INR 1.17   Cardiac Enzymes: No results for input(s): CKTOTAL, CKMB, CKMBINDEX, TROPONINI in the last 168 hours. BNP (last 3 results) No results for input(s): PROBNP in the last 8760 hours. HbA1C: No results for input(s): HGBA1C in the last 72 hours. CBG: No results for input(s): GLUCAP in the last 168 hours. Lipid Profile: No results for input(s): CHOL, HDL, LDLCALC, TRIG, CHOLHDL, LDLDIRECT in the last 72 hours. Thyroid Function Tests: No results for input(s): TSH, T4TOTAL, FREET4, T3FREE, THYROIDAB in the last 72 hours. Anemia Panel: No results for input(s): VITAMINB12, FOLATE, FERRITIN, TIBC, IRON, RETICCTPCT in the last 72 hours. Urine analysis:    Component Value Date/Time   COLORURINE YELLOW 06/19/2015 2234   APPEARANCEUR CLEAR 06/19/2015 2234   LABSPEC 1.014 06/19/2015 2234   PHURINE 6.0 06/19/2015 2234   GLUCOSEU NEGATIVE 06/19/2015 2234   HGBUR NEGATIVE 06/19/2015 2234   BILIRUBINUR NEGATIVE 06/19/2015 2234   KETONESUR NEGATIVE 06/19/2015 2234   PROTEINUR NEGATIVE 06/19/2015 2234   NITRITE NEGATIVE 06/19/2015 2234   LEUKOCYTESUR NEGATIVE 06/19/2015 2234     Radiology Studies: Mr Hip Left Wo Contrast  06/20/2015  CLINICAL DATA:  Status post trip and fall 06/18/2015 with onset of left hip pain. Initial encounter. EXAM: MR OF THE LEFT HIP WITHOUT CONTRAST TECHNIQUE: Multiplanar, multisequence MR imaging was performed. No intravenous contrast was administered. COMPARISON:  Plain films left hip this same day. FINDINGS: Bones: The patient has nondisplaced fractures of the anterior and medial walls of the left acetabulum with associated marrow edema. No other fracture is identified. There is minimal marrow edema and subcortical bone of the anterior left femoral head and neck compatible with contusion. There may also be a small contusion in the inferior pubic ramus. Articular  cartilage and labrum Articular cartilage:  Mildly degenerated. Labrum:  Appears intact. Joint or bursal effusion Joint effusion:  Small left hip joint effusion is seen. Bursae:  No evidence of bursitis. Muscles and tendons Muscles and tendons:  Intact. Other findings Miscellaneous:  None. IMPRESSION: Acute or subacute nondisplaced fractures of the anterior medial walls of the left acetabulum. Likely small contusion in the anterior aspect of the left femoral head and neck and possibly left inferior pubic ramus also identified. No other acute finding. Electronically Signed   By: Inge Rise M.D.   On: 06/20/2015 12:25   Dg Chest Port 1 View  06/19/2015  CLINICAL DATA:  Nondisplaced acetabular fracture EXAM: PORTABLE CHEST 1 VIEW COMPARISON:  MRI 06/19/2015 FINDINGS: Normal cardiac silhouette. Chronic appearing interstitial markings in the lungs. No pulmonary edema. No new infiltrate pneumothorax. Degenerate spurring of the spine. IMPRESSION: Chronic interstitial markings.  No acute findings. Electronically Signed   By: Suzy Bouchard M.D.   On: 06/19/2015 20:34   Dg Hip Unilat With Pelvis 2-3 Views Left  06/19/2015  CLINICAL DATA:  Pain in LEFT hip after fall last night. EXAM: DG HIP (WITH OR WITHOUT PELVIS) 2-3V LEFT COMPARISON:  03/25/2015 FINDINGS: Hips are located. There is joint space narrowing the LEFT and RIGHT hip  joint. Dedicated view of the LEFT hip demonstrates no fracture dislocation. IMPRESSION: 1. No evidence of fracture dislocation of the LEFT hip. 2. Bilateral osteoarthritis of the joints. Electronically Signed   By: Suzy Bouchard M.D.   On: 06/19/2015 16:27        Scheduled Meds: . allopurinol  300 mg Oral Daily  . apixaban  2.5 mg Oral Daily  . aspirin EC  81 mg Oral Daily  . atorvastatin  40 mg Oral Daily  . cholecalciferol  400 Units Oral Daily  . diltiazem  240 mg Oral Daily  . [START ON 06/21/2015] levothyroxine  125 mcg Oral QAC breakfast  . losartan  50 mg Oral  Daily   Continuous Infusions:    LOS: 1 day    Time spent: 20 minutes.    [facility], MD [facility] Pager [phone]  If 7PM-7AM, please contact night-coverage [email or web address] Password [facility] 06/20/2015, 3:02 PM

## 2015-06-21 DIAGNOSIS — I480 Paroxysmal atrial fibrillation: Secondary | ICD-10-CM | POA: Diagnosis not present

## 2015-06-21 DIAGNOSIS — I10 Essential (primary) hypertension: Secondary | ICD-10-CM | POA: Diagnosis not present

## 2015-06-21 DIAGNOSIS — R259 Unspecified abnormal involuntary movements: Secondary | ICD-10-CM | POA: Diagnosis not present

## 2015-06-21 DIAGNOSIS — S32402A Unspecified fracture of left acetabulum, initial encounter for closed fracture: Secondary | ICD-10-CM | POA: Diagnosis present

## 2015-06-21 DIAGNOSIS — S32402D Unspecified fracture of left acetabulum, subsequent encounter for fracture with routine healing: Secondary | ICD-10-CM | POA: Diagnosis not present

## 2015-06-21 DIAGNOSIS — E785 Hyperlipidemia, unspecified: Secondary | ICD-10-CM | POA: Diagnosis not present

## 2015-06-21 LAB — URINE CULTURE

## 2015-06-21 NOTE — NC FL2 (Signed)
[place] MEDICAID FL2 LEVEL OF CARE SCREENING TOOL     IDENTIFICATION  Patient Name: Samuel Shaffer Birthdate: 04-22-1926 Sex: male Admission Date (Current Location): 06/19/2015  [facility] and [place] Number:  Herbalist and Address:  [facility],  [place] [address] [place]      Provider Number: [id number]  Attending Physician Name and Address:  Modena Jansky, MD  Relative Name and Phone Number:       Current Level of Care: Hospital Recommended Level of Care: [place] Prior Approval Number:    Date Approved/Denied:   PASRR Number: [id number]  Discharge Plan: SNF    Current Diagnoses: Patient Active Problem List   Diagnosis Date Noted  . Left acetabular fracture, closed, initial encounter 06/21/2015  . Essential hypertension 06/20/2015  . A-fib ([place]) 06/19/2015  . Closed left acetabular fracture ([place]) 06/19/2015  . Wolff-Parkinson-White (WPW) pattern 06/19/2015  . Hyperlipidemia   . Hypothyroidism   . Gout     Orientation RESPIRATION BLADDER Height & Weight     Self, Time, Situation, Place  Normal Continent Weight: 173 lb (78.472 kg) Height:  5\' 11"  (180.3 cm)  BEHAVIORAL SYMPTOMS/MOOD NEUROLOGICAL BOWEL NUTRITION STATUS   (no behaviors)  (NONE) Continent Diet (Diet Heart)  AMBULATORY STATUS COMMUNICATION OF NEEDS Skin   Extensive Assist (Nondisplaced left acetabular fracture -Plan nonweightbearing for 3 weeks repeat clinical assessment in 3 weeks at my office will likely start weightbearing at that time) Verbally Normal                       Personal Care Assistance Level of Assistance  Bathing, Feeding, Dressing Bathing Assistance: Limited assistance Feeding assistance: Independent Dressing Assistance: Limited assistance     Functional Limitations Info  Sight, Hearing, Speech Sight Info: Adequate Hearing Info: Adequate Speech Info: Adequate    SPECIAL CARE FACTORS FREQUENCY  PT (By  licensed PT), OT (By licensed OT)     PT Frequency: 5 x a week OT Frequency: 5 x a week            Contractures Contractures Info: Not present    Additional Factors Info  Code Status, Allergies, Psychotropic Code Status Info: FULL code status Allergies Info: No Known Allergies           Current Medications (06/21/2015):  This is the current hospital active medication list Current Facility-Administered Medications  Medication Dose Route Frequency Provider Last Rate Last Dose  . allopurinol (ZYLOPRIM) tablet 300 mg  300 mg Oral Daily Etta Quill, DO   300 mg at 06/21/15 1035  . apixaban (ELIQUIS) tablet 2.5 mg  2.5 mg Oral BID Modena Jansky, MD   2.5 mg at 06/21/15 1036  . aspirin EC tablet 81 mg  81 mg Oral Daily Etta Quill, DO   81 mg at 06/21/15 1035  . atorvastatin (LIPITOR) tablet 40 mg  40 mg Oral Daily Etta Quill, DO   40 mg at 06/21/15 1035  . cholecalciferol (VITAMIN D) tablet 400 Units  400 Units Oral Daily Etta Quill, DO   400 Units at 06/21/15 1035  . diltiazem (DILACOR XR) 24 hr capsule 240 mg  240 mg Oral Daily Etta Quill, DO   240 mg at 06/21/15 1036  . HYDROcodone-acetaminophen (NORCO/VICODIN) 5-325 MG per tablet 1-2 tablet  1-2 tablet Oral Q6H PRN Etta Quill, DO      . levothyroxine (SYNTHROID, LEVOTHROID) tablet 125 mcg  125 mcg Oral QAC breakfast Etta Quill, DO   125 mcg at 06/21/15 [id number]  . losartan (COZAAR) tablet 50 mg  50 mg Oral Daily Etta Quill, DO   50 mg at 06/21/15 1036  . morphine 2 MG/ML injection 0.5 mg  0.5 mg Intravenous Q2H PRN Etta Quill, DO         Discharge Medications: Please see discharge summary for a list of discharge medications.  Relevant Imaging Results:  Relevant Lab Results:   Additional Information SSN: [id number]  Taryll Reichenberger, [place] Better A, LCSW

## 2015-06-21 NOTE — Care Management Obs Status (Signed)
[place] NOTIFICATION   Patient Details  Name: Samuel Shaffer MRN: [id number] Date of Birth: [birth year]   Medicare Observation Status Notification Given:  Yes    Guadalupe Maple, RN 06/21/2015, 11:38 AM

## 2015-06-21 NOTE — Clinical Social Work Placement (Signed)
   CLINICAL SOCIAL WORK PLACEMENT  NOTE  Date:  06/21/2015  Patient Details  Name: Samuel Shaffer MRN: [id number] Date of Birth: 1927-01-03  Clinical Social Work is seeking post-discharge placement for this patient at the [place] level of care (*CSW will initial, date and re-position this form in  chart as items are completed):  Yes   Patient/family provided with [place] Work Department's list of facilities offering this level of care within the geographic area requested by the patient (or if unable, by the patient's family).  Yes   Patient/family informed of their freedom to choose among providers that offer the needed level of care, that participate in Medicare, Medicaid or managed care program needed by the patient, have an available bed and are willing to accept the patient.  Yes   Patient/family informed of [place]'s ownership interest in [facility] and [facility], as well as of the fact that they are under no obligation to receive care at these facilities.  PASRR submitted to EDS on 06/21/15     PASRR number received on 06/21/15     Existing PASRR number confirmed on       FL2 transmitted to all facilities in geographic area requested by pt/family on 06/21/15     FL2 transmitted to all facilities within larger geographic area on       Patient informed that his/her managed care company has contracts with or will negotiate with certain facilities, including the following:        Yes   Patient/family informed of bed offers received.  Patient chooses bed at [facility]     Physician recommends and patient chooses bed at      Patient to be transferred to [facility] on 06/21/15.  Patient to be transferred to facility by ambulance Corey Harold)     Patient family notified on 06/21/15 of transfer.  Name of family member notified:  pt notified at bedside     PHYSICIAN Please sign FL2     Additional Comment:     _______________________________________________ Ladell Pier, LCSW 06/21/2015, 2:47 PM

## 2015-06-21 NOTE — Progress Notes (Signed)
Nurse attempted to call report twice to [facility] at [phone]. Telephone rang until disconnected both times.

## 2015-06-21 NOTE — Clinical Social Work Placement (Signed)
   CLINICAL SOCIAL WORK PLACEMENT  NOTE  Date:  06/21/2015  Patient Details  Name: Samuel Shaffer MRN: [id number] Date of Birth: [birth year]  Clinical Social Work is seeking post-discharge placement for this patient at the [place] level of care (*CSW will initial, date and re-position this form in  chart as items are completed):  Yes   Patient/family provided with [place] Work Department's list of facilities offering this level of care within the geographic area requested by the patient (or if unable, by the patient's family).  Yes   Patient/family informed of their freedom to choose among providers that offer the needed level of care, that participate in Medicare, Medicaid or managed care program needed by the patient, have an available bed and are willing to accept the patient.  Yes   Patient/family informed of [place]'s ownership interest in [facility] and [facility], as well as of the fact that they are under no obligation to receive care at these facilities.  PASRR submitted to EDS on 06/21/15     PASRR number received on 06/21/15     Existing PASRR number confirmed on       FL2 transmitted to all facilities in geographic area requested by pt/family on 06/21/15     FL2 transmitted to all facilities within larger geographic area on       Patient informed that his/her managed care company has contracts with or will negotiate with certain facilities, including the following:        Yes   Patient/family informed of bed offers received.  Patient chooses bed at [facility]     Physician recommends and patient chooses bed at      Patient to be transferred to [facility] on  .  Patient to be transferred to facility by       Patient family notified on   of transfer.  Name of family member notified:        PHYSICIAN Please sign FL2     Additional Comment:    _______________________________________________ Ladell Pier, LCSW 06/21/2015, 12:11 PM

## 2015-06-21 NOTE — Clinical Social Work Note (Signed)
Clinical Social Work Assessment  Patient Details  Name: Samuel Shaffer MRN: [id number] Date of Birth: 01-Nov-1926  Date of referral:  06/21/15               Reason for consult:  Facility Placement                Permission sought to share information with:  Facility Art therapist granted to share information::     Name::        Agency::     Relationship::     Contact Information:     Housing/Transportation Living arrangements for the past 2 months:  [place] of Information:  Patient Patient Interpreter Needed:  None Criminal Activity/Legal Involvement Pertinent to Current Situation/Hospitalization:  No - Comment as needed Significant Relationships:  None Lives with:  Self Do you feel safe going back to the place where you live?  No (plan for [facility] SNF) Need for family participation in patient care:  No ([place])  Care giving concerns:  Pt admitted from [facility] ILF-needs [facility] SNF upon d/c.   Social Worker assessment / plan:    CSW received referral for New SNF.   CSW received notification that pt has been switched to obs status and will not meet criteria for 3 night inpatient stay for Medicare coverage to pay for SNF. CSW spoke with [facility] regarding this and [facility] states that pt can come to SNF section of [facility] as pt has 30 days at [facility] under contract with [employer]. Bed available today.   CSW discussed with pt who is agreeable to [facility] SNF.   CSW to facilitate pt discharge to [facility] today.  Employment status:  Retired Forensic scientist:  Medicare PT Recommendations:  [place] / Referral to community resources:  [place]  Patient/Family's Response to care:  Pt alert and oriented x 4. Pt agreeable to [facility] today SNF section and understands it will not be under Medicare coverage due to obs status.  Patient/Family's Understanding of and  Emotional Response to Diagnosis, Current Treatment, and Prognosis:  Pt displayed understanding surrounding pt diagnosis and treatment plan.   Emotional Assessment Appearance:  Appears stated age Attitude/Demeanor/Rapport:  Other (pt cooperative) Affect (typically observed):  Appropriate Orientation:  Oriented to Self, Oriented to Place, Oriented to  Time, Oriented to Situation Alcohol / Substance use:  Not Applicable Psych involvement (Current and /or in the community):  No (Comment)  Discharge Needs  Concerns to be addressed:  Discharge Planning Concerns Readmission within the last 30 days:  No Current discharge risk:  None Barriers to Discharge:  No Barriers Identified   [place], [place], LCSW 06/21/2015, 2:44 PM [phone]

## 2015-06-21 NOTE — Discharge Summary (Signed)
Physician Discharge Summary  STANDLY Shaffer  [id number]  DOB: 07-23-1926  DOA: 06/19/2015  PCP: [name], MD  Admit date: 06/19/2015 Discharge date: 06/21/2015  Time spent: Greater than 30 minutes  Recommendations for Outpatient Follow-up:  1. M.D. at SNF in 2-3 days. 2. Dr. [name], Orthopedics in 3 weeks. SNF to arrange follow-up appointment. 3. Dr. [name], PCP upon discharge from SNF. 4. Nonweightbearing for 3 weeks on left lower extremity. Repeat clinical assessment by orthopedics in 3 weeks at which time they will likely start weightbearing.  Discharge Diagnoses:  Principal Problem:   Closed left acetabular fracture ([place]) Active Problems:   Hyperlipidemia   Hypothyroidism   A-fib (HCC)   Wolff-Parkinson-White (WPW) pattern   Essential hypertension   Left acetabular fracture, closed, initial encounter   Discharge Condition: Improved & Stable  Diet recommendation: Heart healthy diet.  Filed Weights   06/20/15 0805  Weight: 78.472 kg (173 lb)    History of present illness:  82 year old male with history of HTN, HLD, hypothyroid, gout, stroke, PAF, bladder cancer status post resection, presented to ED with complaints of anterior left hip pain that started on the morning of admission after reported mechanical fall without syncope on the night prior. Imaging confirmed left acetabular fracture. Orthopedics consulted and recommended nonweightbearing on left lower extremity 3 weeks and outpatient follow-up.  Hospital Course:   Nondisplaced left acetabular fracture - Sustained status post mechanical fall. - This did not show up on plain x-ray but revealed on MRI of the left hip. - Orthopedic consultation appreciated. Nonweightbearing 3 weeks followed by repeat clinical assessment in orthopedic office at which time likely start weightbearing. - Discussed with PCP and confirmed that patient was only on Eliquis 2.5 MG bid. He was not on  aspirin prior to admission and this will be discontinued and removed from his medication list.  Hyperlipidemia - Continue atorvastatin  Hypothyroid - Continue levothyroxin  Paroxysmal A. Fib - Seems to be in sinus rhythm currently. Continue diltiazem and Eliquis. Discussed with Dr. [name] today and confirmed that patient was not on aspirin.  ???WPW - Not convinced of this on admission EKG.  Essential hypertension - Controlled. Continue losartan and diltiazem CD.  Gout - No acute issues. Continue allopurinol.   Consultants:   Orthopedics  Procedures:   None   Discharge Exam:  Complaints:  States that he is doing "fine". Indicates only 1/10 pain of left groin. Denies any other complaints. No chest pain, dyspnea, dizziness or lightheadedness reported.  Filed Vitals:   06/20/15 1932 06/21/15 0556 06/21/15 0956 06/21/15 1341  BP: 130/83 134/59 116/74 131/52  Pulse: 77 71 73 73  Temp: 97.8 F (36.6 C) 98 F (36.7 C) 97.8 F (36.6 C) 98 F (36.7 C)  TempSrc: Oral Oral Oral Oral  Resp: 16 18 16 16   Height:      Weight:      SpO2: 92% 91% 96% 95%    General exam: Pleasant elderly male sitting up comfortably in chair this morning. Respiratory system: Clear. No increased work of breathing. Cardiovascular system: S1 & S2 heard, RRR. No JVD, murmurs, gallops, clicks or pedal edema. Gastrointestinal system: Abdomen is nondistended, soft and nontender. Normal bowel sounds heard. Central nervous system: Alert and oriented. No focal neurological deficits. Extremities: Symmetric 5 x 5 power.  Discharge Instructions      Discharge Instructions    Call MD for:  difficulty breathing, headache or visual disturbances    Complete by:  As  directed      Call MD for:  extreme fatigue    Complete by:  As directed      Call MD for:  persistant dizziness or light-headedness    Complete by:  As directed      Call MD for:  persistant nausea and vomiting    Complete by:  As  directed      Call MD for:  severe uncontrolled pain    Complete by:  As directed      Call MD for:  temperature >100.4    Complete by:  As directed      Diet - low sodium heart healthy    Complete by:  As directed      Discharge instructions    Complete by:  As directed   nonweightbearing for 3 weeks on left lower extremity, repeat clinical assessment in 3 weeks at orthopedics office & will likely start weightbearing at that time.     Increase activity slowly    Complete by:  As directed             Medication List    STOP taking these medications        aspirin EC 81 MG tablet      TAKE these medications        allopurinol 300 MG tablet  Commonly known as:  ZYLOPRIM  Take 300 mg by mouth daily.     atorvastatin 40 MG tablet  Commonly known as:  LIPITOR  Take 40 mg by mouth daily.     diltiazem 240 MG 24 hr capsule  Commonly known as:  DILACOR XR  Take 240 mg by mouth daily.     ELIQUIS 2.5 MG Tabs tablet  Generic drug:  apixaban  Take 2.5 mg by mouth 2 (two) times daily.     HYDROcodone-acetaminophen 5-325 MG tablet  Commonly known as:  NORCO/VICODIN  Take 1-2 tablets by mouth every 6 (six) hours as needed for moderate pain.     losartan 50 MG tablet  Commonly known as:  COZAAR  Take 50 mg by mouth daily.     SYNTHROID 125 MCG tablet  Generic drug:  levothyroxine  Take 125 mcg by mouth daily.     Vitamin D (Cholecalciferol) 400 units Caps  Take 400 Units by mouth daily.       Follow-up Information    Follow up with [name], MD In 3 weeks.   Specialty:  Orthopedic Surgery   Contact information:   [place] [address] [phone]       Follow up with [facility] .   Specialty:  [place] information:   [address] [place] [phone]      Get Medicines reviewed and adjusted: Please take all your medications with you for your next visit with  your Primary MD  Please request your Primary MD to go over all hospital tests and procedure/radiological results at the follow up. Please ask your Primary MD to get all Hospital records sent to his/her office.  If you experience worsening of your admission symptoms, develop shortness of breath, life threatening emergency, suicidal or homicidal thoughts you must seek medical attention immediately by calling 911 or calling your MD immediately if symptoms less severe.  You must read complete instructions/literature along with all the possible adverse reactions/side effects for all the Medicines you take and that have been prescribed to you. Take any new  Medicines after you have completely understood and accept all the possible adverse reactions/side effects.   Do not drive when taking pain medications.   Do not take more than prescribed Pain, Sleep and Anxiety Medications  Special Instructions: If you have smoked or chewed Tobacco in the last 2 yrs please stop smoking, stop any regular Alcohol and or any Recreational drug use.  Wear Seat belts while driving.  Please note  You were cared for by a hospitalist during your hospital stay. Once you are discharged, your primary care physician will handle any further medical issues. Please note that NO REFILLS for any discharge medications will be authorized once you are discharged, as it is imperative that you return to your primary care physician (or establish a relationship with a primary care physician if you do not have one) for your aftercare needs so that they can reassess your need for medications and monitor your lab values.    The results of significant diagnostics from this hospitalization (including imaging, microbiology, ancillary and laboratory) are listed below for reference.    Significant Diagnostic Studies: Mr Hip Left Wo Contrast  06/20/2015  CLINICAL DATA:  Status post trip and fall 06/18/2015 with onset of left hip pain.  Initial encounter. EXAM: MR OF THE LEFT HIP WITHOUT CONTRAST TECHNIQUE: Multiplanar, multisequence MR imaging was performed. No intravenous contrast was administered. COMPARISON:  Plain films left hip this same day. FINDINGS: Bones: The patient has nondisplaced fractures of the anterior and medial walls of the left acetabulum with associated marrow edema. No other fracture is identified. There is minimal marrow edema and subcortical bone of the anterior left femoral head and neck compatible with contusion. There may also be a small contusion in the inferior pubic ramus. Articular cartilage and labrum Articular cartilage:  Mildly degenerated. Labrum:  Appears intact. Joint or bursal effusion Joint effusion:  Small left hip joint effusion is seen. Bursae:  No evidence of bursitis. Muscles and tendons Muscles and tendons:  Intact. Other findings Miscellaneous:  None. IMPRESSION: Acute or subacute nondisplaced fractures of the anterior medial walls of the left acetabulum. Likely small contusion in the anterior aspect of the left femoral head and neck and possibly left inferior pubic ramus also identified. No other acute finding. Electronically Signed   By: Inge Rise M.D.   On: 06/20/2015 12:25   Dg Chest Port 1 View  06/19/2015  CLINICAL DATA:  Nondisplaced acetabular fracture EXAM: PORTABLE CHEST 1 VIEW COMPARISON:  MRI 06/19/2015 FINDINGS: Normal cardiac silhouette. Chronic appearing interstitial markings in the lungs. No pulmonary edema. No new infiltrate pneumothorax. Degenerate spurring of the spine. IMPRESSION: Chronic interstitial markings.  No acute findings. Electronically Signed   By: Suzy Bouchard M.D.   On: 06/19/2015 20:34   Dg Hip Unilat With Pelvis 2-3 Views Left  06/19/2015  CLINICAL DATA:  Pain in LEFT hip after fall last night. EXAM: DG HIP (WITH OR WITHOUT PELVIS) 2-3V LEFT COMPARISON:  03/25/2015 FINDINGS: Hips are located. There is joint space narrowing the LEFT and RIGHT hip joint.  Dedicated view of the LEFT hip demonstrates no fracture dislocation. IMPRESSION: 1. No evidence of fracture dislocation of the LEFT hip. 2. Bilateral osteoarthritis of the joints. Electronically Signed   By: Suzy Bouchard M.D.   On: 06/19/2015 16:27    Microbiology: Recent Results (from the past 240 hour(s))  Urine culture     Status: Abnormal   Collection Time: 06/19/15 10:34 PM  Result Value Ref Range Status   Specimen  Description URINE, CLEAN CATCH  Final   Special Requests NONE  Final   Culture (A)  Final    9,000 COLONIES/mL INSIGNIFICANT GROWTH Performed at [facility]    Report Status 06/21/2015 FINAL  Final     Labs: Basic Metabolic Panel:  Recent Labs Lab 06/19/15 2009  NA 141  K 4.0  CL 110  CO2 25  GLUCOSE 107*  BUN 24*  CREATININE 1.17  CALCIUM 9.0   Liver Function Tests: No results for input(s): AST, ALT, ALKPHOS, BILITOT, PROT, ALBUMIN in the last 168 hours. No results for input(s): LIPASE, AMYLASE in the last 168 hours. No results for input(s): AMMONIA in the last 168 hours. CBC:  Recent Labs Lab 06/19/15 2009  WBC 9.7  NEUTROABS 6.6  HGB 14.1  HCT 41.8  MCV 91.7  PLT 183   Cardiac Enzymes: No results for input(s): CKTOTAL, CKMB, CKMBINDEX, TROPONINI in the last 168 hours. BNP: BNP (last 3 results) No results for input(s): BNP in the last 8760 hours.  ProBNP (last 3 results) No results for input(s): PROBNP in the last 8760 hours.  CBG: No results for input(s): GLUCAP in the last 168 hours.     Signed:  Vernell Leep, MD, FACP, FHM. [facility] Pager [phone]  If 7PM-7AM, please contact night-coverage [email or web address] Password [email or web address] 06/21/2015, 2:03 PM

## 2015-06-21 NOTE — Progress Notes (Signed)
Nutrition Brief Note  RD consulted via Hip Fracture Protocol. Patient with no weight loss history PTA. PT is eating 100% of meals.  Wt Readings from Last 15 Encounters:  06/20/15 173 lb (78.472 kg)    Body mass index is 24.14 kg/(m^2). Patient meets criteria for normal range based on current BMI.   Current diet order is Heart Healthy, patient is consuming approximately 100% of meals at this time. Labs and medications reviewed.   No nutrition interventions warranted at this time. If nutrition issues arise, please consult RD.   Clayton Bibles, MS, RD, LDN Pager: [phone] After Hours Pager: [phone]

## 2015-06-21 NOTE — Progress Notes (Signed)
Pt for discharge to [facility].   CSW facilitated pt discharge needs including contacting facility, faxing pt discharge information via epic hub, discussing with pt at bedside, pt reports that he does not have anyone locally to notify re: transfer, providing RN phone number to call report, and arranging ambulance transport for pt to [facility].  No further social work needs identified at this time.  CSW signing off.  Alison Murray, MSW, [place] Work [phone]

## 2015-06-21 NOTE — Progress Notes (Signed)
Pt admitted from [place] [facility]. PT recommending SNF. Pt changed to observation status today. CSW spoke with [facility] who stated facility can accept to SNF level even though pt observation as pt contract with facility include 30 free SNF days. [facility] will call and discuss those details with the pt. [facility] has bed available for pt today.   Alison Murray, MSW, [place] Work [phone]

## 2015-06-22 DIAGNOSIS — I480 Paroxysmal atrial fibrillation: Secondary | ICD-10-CM | POA: Diagnosis not present

## 2015-06-22 DIAGNOSIS — S32402A Unspecified fracture of left acetabulum, initial encounter for closed fracture: Secondary | ICD-10-CM | POA: Diagnosis not present

## 2015-06-22 DIAGNOSIS — I10 Essential (primary) hypertension: Secondary | ICD-10-CM | POA: Diagnosis not present

## 2015-06-22 DIAGNOSIS — E039 Hypothyroidism, unspecified: Secondary | ICD-10-CM | POA: Diagnosis not present

## 2015-06-23 DIAGNOSIS — I10 Essential (primary) hypertension: Secondary | ICD-10-CM | POA: Diagnosis not present

## 2015-06-24 DIAGNOSIS — R262 Difficulty in walking, not elsewhere classified: Secondary | ICD-10-CM | POA: Diagnosis not present

## 2015-06-24 DIAGNOSIS — M6281 Muscle weakness (generalized): Secondary | ICD-10-CM | POA: Diagnosis not present

## 2015-06-24 DIAGNOSIS — Z9181 History of falling: Secondary | ICD-10-CM | POA: Diagnosis not present

## 2015-06-24 DIAGNOSIS — S32402D Unspecified fracture of left acetabulum, subsequent encounter for fracture with routine healing: Secondary | ICD-10-CM | POA: Diagnosis not present

## 2015-06-25 DIAGNOSIS — Z9181 History of falling: Secondary | ICD-10-CM | POA: Diagnosis not present

## 2015-06-25 DIAGNOSIS — S32402D Unspecified fracture of left acetabulum, subsequent encounter for fracture with routine healing: Secondary | ICD-10-CM | POA: Diagnosis not present

## 2015-06-25 DIAGNOSIS — M6281 Muscle weakness (generalized): Secondary | ICD-10-CM | POA: Diagnosis not present

## 2015-06-25 DIAGNOSIS — R262 Difficulty in walking, not elsewhere classified: Secondary | ICD-10-CM | POA: Diagnosis not present

## 2015-06-26 DIAGNOSIS — M6281 Muscle weakness (generalized): Secondary | ICD-10-CM | POA: Diagnosis not present

## 2015-06-26 DIAGNOSIS — Z9181 History of falling: Secondary | ICD-10-CM | POA: Diagnosis not present

## 2015-06-26 DIAGNOSIS — S32402D Unspecified fracture of left acetabulum, subsequent encounter for fracture with routine healing: Secondary | ICD-10-CM | POA: Diagnosis not present

## 2015-06-26 DIAGNOSIS — R262 Difficulty in walking, not elsewhere classified: Secondary | ICD-10-CM | POA: Diagnosis not present

## 2015-06-27 DIAGNOSIS — M6281 Muscle weakness (generalized): Secondary | ICD-10-CM | POA: Diagnosis not present

## 2015-06-27 DIAGNOSIS — Z9181 History of falling: Secondary | ICD-10-CM | POA: Diagnosis not present

## 2015-06-27 DIAGNOSIS — S32402D Unspecified fracture of left acetabulum, subsequent encounter for fracture with routine healing: Secondary | ICD-10-CM | POA: Diagnosis not present

## 2015-06-27 DIAGNOSIS — R262 Difficulty in walking, not elsewhere classified: Secondary | ICD-10-CM | POA: Diagnosis not present

## 2015-06-28 DIAGNOSIS — S32402D Unspecified fracture of left acetabulum, subsequent encounter for fracture with routine healing: Secondary | ICD-10-CM | POA: Diagnosis not present

## 2015-06-28 DIAGNOSIS — Z9181 History of falling: Secondary | ICD-10-CM | POA: Diagnosis not present

## 2015-06-28 DIAGNOSIS — R262 Difficulty in walking, not elsewhere classified: Secondary | ICD-10-CM | POA: Diagnosis not present

## 2015-06-28 DIAGNOSIS — M6281 Muscle weakness (generalized): Secondary | ICD-10-CM | POA: Diagnosis not present

## 2015-06-29 DIAGNOSIS — Z9181 History of falling: Secondary | ICD-10-CM | POA: Diagnosis not present

## 2015-06-29 DIAGNOSIS — S32402D Unspecified fracture of left acetabulum, subsequent encounter for fracture with routine healing: Secondary | ICD-10-CM | POA: Diagnosis not present

## 2015-06-29 DIAGNOSIS — M6281 Muscle weakness (generalized): Secondary | ICD-10-CM | POA: Diagnosis not present

## 2015-06-29 DIAGNOSIS — R262 Difficulty in walking, not elsewhere classified: Secondary | ICD-10-CM | POA: Diagnosis not present

## 2015-06-30 DIAGNOSIS — S32402A Unspecified fracture of left acetabulum, initial encounter for closed fracture: Secondary | ICD-10-CM | POA: Diagnosis not present

## 2015-06-30 DIAGNOSIS — I10 Essential (primary) hypertension: Secondary | ICD-10-CM | POA: Diagnosis not present

## 2015-06-30 DIAGNOSIS — E039 Hypothyroidism, unspecified: Secondary | ICD-10-CM | POA: Diagnosis not present

## 2015-06-30 DIAGNOSIS — R262 Difficulty in walking, not elsewhere classified: Secondary | ICD-10-CM | POA: Diagnosis not present

## 2015-06-30 DIAGNOSIS — Z9181 History of falling: Secondary | ICD-10-CM | POA: Diagnosis not present

## 2015-06-30 DIAGNOSIS — I480 Paroxysmal atrial fibrillation: Secondary | ICD-10-CM | POA: Diagnosis not present

## 2015-06-30 DIAGNOSIS — M6281 Muscle weakness (generalized): Secondary | ICD-10-CM | POA: Diagnosis not present

## 2015-06-30 DIAGNOSIS — S32402D Unspecified fracture of left acetabulum, subsequent encounter for fracture with routine healing: Secondary | ICD-10-CM | POA: Diagnosis not present

## 2015-07-01 DIAGNOSIS — R262 Difficulty in walking, not elsewhere classified: Secondary | ICD-10-CM | POA: Diagnosis not present

## 2015-07-01 DIAGNOSIS — S32402D Unspecified fracture of left acetabulum, subsequent encounter for fracture with routine healing: Secondary | ICD-10-CM | POA: Diagnosis not present

## 2015-07-01 DIAGNOSIS — M6281 Muscle weakness (generalized): Secondary | ICD-10-CM | POA: Diagnosis not present

## 2015-07-01 DIAGNOSIS — Z9181 History of falling: Secondary | ICD-10-CM | POA: Diagnosis not present

## 2015-07-02 DIAGNOSIS — S32402D Unspecified fracture of left acetabulum, subsequent encounter for fracture with routine healing: Secondary | ICD-10-CM | POA: Diagnosis not present

## 2015-07-02 DIAGNOSIS — Z9181 History of falling: Secondary | ICD-10-CM | POA: Diagnosis not present

## 2015-07-02 DIAGNOSIS — R262 Difficulty in walking, not elsewhere classified: Secondary | ICD-10-CM | POA: Diagnosis not present

## 2015-07-02 DIAGNOSIS — M6281 Muscle weakness (generalized): Secondary | ICD-10-CM | POA: Diagnosis not present

## 2015-07-03 DIAGNOSIS — M6281 Muscle weakness (generalized): Secondary | ICD-10-CM | POA: Diagnosis not present

## 2015-07-03 DIAGNOSIS — S32402D Unspecified fracture of left acetabulum, subsequent encounter for fracture with routine healing: Secondary | ICD-10-CM | POA: Diagnosis not present

## 2015-07-03 DIAGNOSIS — R262 Difficulty in walking, not elsewhere classified: Secondary | ICD-10-CM | POA: Diagnosis not present

## 2015-07-03 DIAGNOSIS — Z9181 History of falling: Secondary | ICD-10-CM | POA: Diagnosis not present

## 2015-07-04 DIAGNOSIS — Z9181 History of falling: Secondary | ICD-10-CM | POA: Diagnosis not present

## 2015-07-04 DIAGNOSIS — R262 Difficulty in walking, not elsewhere classified: Secondary | ICD-10-CM | POA: Diagnosis not present

## 2015-07-04 DIAGNOSIS — S32402D Unspecified fracture of left acetabulum, subsequent encounter for fracture with routine healing: Secondary | ICD-10-CM | POA: Diagnosis not present

## 2015-07-04 DIAGNOSIS — M6281 Muscle weakness (generalized): Secondary | ICD-10-CM | POA: Diagnosis not present

## 2015-07-05 DIAGNOSIS — S32402D Unspecified fracture of left acetabulum, subsequent encounter for fracture with routine healing: Secondary | ICD-10-CM | POA: Diagnosis not present

## 2015-07-05 DIAGNOSIS — Z9181 History of falling: Secondary | ICD-10-CM | POA: Diagnosis not present

## 2015-07-05 DIAGNOSIS — M6281 Muscle weakness (generalized): Secondary | ICD-10-CM | POA: Diagnosis not present

## 2015-07-05 DIAGNOSIS — R262 Difficulty in walking, not elsewhere classified: Secondary | ICD-10-CM | POA: Diagnosis not present

## 2015-07-06 DIAGNOSIS — Z9181 History of falling: Secondary | ICD-10-CM | POA: Diagnosis not present

## 2015-07-06 DIAGNOSIS — R262 Difficulty in walking, not elsewhere classified: Secondary | ICD-10-CM | POA: Diagnosis not present

## 2015-07-06 DIAGNOSIS — M6281 Muscle weakness (generalized): Secondary | ICD-10-CM | POA: Diagnosis not present

## 2015-07-06 DIAGNOSIS — S32402D Unspecified fracture of left acetabulum, subsequent encounter for fracture with routine healing: Secondary | ICD-10-CM | POA: Diagnosis not present

## 2015-07-07 DIAGNOSIS — M6281 Muscle weakness (generalized): Secondary | ICD-10-CM | POA: Diagnosis not present

## 2015-07-07 DIAGNOSIS — R262 Difficulty in walking, not elsewhere classified: Secondary | ICD-10-CM | POA: Diagnosis not present

## 2015-07-07 DIAGNOSIS — Z9181 History of falling: Secondary | ICD-10-CM | POA: Diagnosis not present

## 2015-07-07 DIAGNOSIS — S32402D Unspecified fracture of left acetabulum, subsequent encounter for fracture with routine healing: Secondary | ICD-10-CM | POA: Diagnosis not present

## 2015-07-08 DIAGNOSIS — R262 Difficulty in walking, not elsewhere classified: Secondary | ICD-10-CM | POA: Diagnosis not present

## 2015-07-08 DIAGNOSIS — Z9181 History of falling: Secondary | ICD-10-CM | POA: Diagnosis not present

## 2015-07-08 DIAGNOSIS — M6281 Muscle weakness (generalized): Secondary | ICD-10-CM | POA: Diagnosis not present

## 2015-07-08 DIAGNOSIS — S32402D Unspecified fracture of left acetabulum, subsequent encounter for fracture with routine healing: Secondary | ICD-10-CM | POA: Diagnosis not present

## 2015-07-09 DIAGNOSIS — Z9181 History of falling: Secondary | ICD-10-CM | POA: Diagnosis not present

## 2015-07-09 DIAGNOSIS — R262 Difficulty in walking, not elsewhere classified: Secondary | ICD-10-CM | POA: Diagnosis not present

## 2015-07-09 DIAGNOSIS — S32402D Unspecified fracture of left acetabulum, subsequent encounter for fracture with routine healing: Secondary | ICD-10-CM | POA: Diagnosis not present

## 2015-07-09 DIAGNOSIS — M6281 Muscle weakness (generalized): Secondary | ICD-10-CM | POA: Diagnosis not present

## 2015-07-12 DIAGNOSIS — S32402D Unspecified fracture of left acetabulum, subsequent encounter for fracture with routine healing: Secondary | ICD-10-CM | POA: Diagnosis not present

## 2015-07-12 DIAGNOSIS — R262 Difficulty in walking, not elsewhere classified: Secondary | ICD-10-CM | POA: Diagnosis not present

## 2015-07-12 DIAGNOSIS — M6281 Muscle weakness (generalized): Secondary | ICD-10-CM | POA: Diagnosis not present

## 2015-07-12 DIAGNOSIS — S32412D Displaced fracture of anterior wall of left acetabulum, subsequent encounter for fracture with routine healing: Secondary | ICD-10-CM | POA: Diagnosis not present

## 2015-07-12 DIAGNOSIS — Z9181 History of falling: Secondary | ICD-10-CM | POA: Diagnosis not present

## 2015-07-13 DIAGNOSIS — R262 Difficulty in walking, not elsewhere classified: Secondary | ICD-10-CM | POA: Diagnosis not present

## 2015-07-13 DIAGNOSIS — S32402D Unspecified fracture of left acetabulum, subsequent encounter for fracture with routine healing: Secondary | ICD-10-CM | POA: Diagnosis not present

## 2015-07-13 DIAGNOSIS — M6281 Muscle weakness (generalized): Secondary | ICD-10-CM | POA: Diagnosis not present

## 2015-07-13 DIAGNOSIS — Z9181 History of falling: Secondary | ICD-10-CM | POA: Diagnosis not present

## 2015-07-14 DIAGNOSIS — R262 Difficulty in walking, not elsewhere classified: Secondary | ICD-10-CM | POA: Diagnosis not present

## 2015-07-14 DIAGNOSIS — M6281 Muscle weakness (generalized): Secondary | ICD-10-CM | POA: Diagnosis not present

## 2015-07-14 DIAGNOSIS — S32402D Unspecified fracture of left acetabulum, subsequent encounter for fracture with routine healing: Secondary | ICD-10-CM | POA: Diagnosis not present

## 2015-07-14 DIAGNOSIS — Z9181 History of falling: Secondary | ICD-10-CM | POA: Diagnosis not present

## 2015-07-15 DIAGNOSIS — Z9181 History of falling: Secondary | ICD-10-CM | POA: Diagnosis not present

## 2015-07-15 DIAGNOSIS — M6281 Muscle weakness (generalized): Secondary | ICD-10-CM | POA: Diagnosis not present

## 2015-07-15 DIAGNOSIS — S32402D Unspecified fracture of left acetabulum, subsequent encounter for fracture with routine healing: Secondary | ICD-10-CM | POA: Diagnosis not present

## 2015-07-15 DIAGNOSIS — R262 Difficulty in walking, not elsewhere classified: Secondary | ICD-10-CM | POA: Diagnosis not present

## 2015-07-16 DIAGNOSIS — M6281 Muscle weakness (generalized): Secondary | ICD-10-CM | POA: Diagnosis not present

## 2015-07-16 DIAGNOSIS — S32402D Unspecified fracture of left acetabulum, subsequent encounter for fracture with routine healing: Secondary | ICD-10-CM | POA: Diagnosis not present

## 2015-07-16 DIAGNOSIS — Z9181 History of falling: Secondary | ICD-10-CM | POA: Diagnosis not present

## 2015-07-16 DIAGNOSIS — R262 Difficulty in walking, not elsewhere classified: Secondary | ICD-10-CM | POA: Diagnosis not present

## 2015-07-19 DIAGNOSIS — R262 Difficulty in walking, not elsewhere classified: Secondary | ICD-10-CM | POA: Diagnosis not present

## 2015-07-19 DIAGNOSIS — Z9181 History of falling: Secondary | ICD-10-CM | POA: Diagnosis not present

## 2015-07-19 DIAGNOSIS — S32402D Unspecified fracture of left acetabulum, subsequent encounter for fracture with routine healing: Secondary | ICD-10-CM | POA: Diagnosis not present

## 2015-07-19 DIAGNOSIS — M6281 Muscle weakness (generalized): Secondary | ICD-10-CM | POA: Diagnosis not present

## 2015-07-20 DIAGNOSIS — R262 Difficulty in walking, not elsewhere classified: Secondary | ICD-10-CM | POA: Diagnosis not present

## 2015-07-20 DIAGNOSIS — Z9181 History of falling: Secondary | ICD-10-CM | POA: Diagnosis not present

## 2015-07-20 DIAGNOSIS — M6281 Muscle weakness (generalized): Secondary | ICD-10-CM | POA: Diagnosis not present

## 2015-07-20 DIAGNOSIS — S32402D Unspecified fracture of left acetabulum, subsequent encounter for fracture with routine healing: Secondary | ICD-10-CM | POA: Diagnosis not present

## 2015-07-21 DIAGNOSIS — M6281 Muscle weakness (generalized): Secondary | ICD-10-CM | POA: Diagnosis not present

## 2015-07-21 DIAGNOSIS — R262 Difficulty in walking, not elsewhere classified: Secondary | ICD-10-CM | POA: Diagnosis not present

## 2015-07-21 DIAGNOSIS — Z9181 History of falling: Secondary | ICD-10-CM | POA: Diagnosis not present

## 2015-07-21 DIAGNOSIS — S32402D Unspecified fracture of left acetabulum, subsequent encounter for fracture with routine healing: Secondary | ICD-10-CM | POA: Diagnosis not present

## 2015-07-22 DIAGNOSIS — M6281 Muscle weakness (generalized): Secondary | ICD-10-CM | POA: Diagnosis not present

## 2015-07-22 DIAGNOSIS — R262 Difficulty in walking, not elsewhere classified: Secondary | ICD-10-CM | POA: Diagnosis not present

## 2015-07-22 DIAGNOSIS — Z9181 History of falling: Secondary | ICD-10-CM | POA: Diagnosis not present

## 2015-07-22 DIAGNOSIS — S32402D Unspecified fracture of left acetabulum, subsequent encounter for fracture with routine healing: Secondary | ICD-10-CM | POA: Diagnosis not present

## 2015-07-23 DIAGNOSIS — M6281 Muscle weakness (generalized): Secondary | ICD-10-CM | POA: Diagnosis not present

## 2015-07-23 DIAGNOSIS — S32402D Unspecified fracture of left acetabulum, subsequent encounter for fracture with routine healing: Secondary | ICD-10-CM | POA: Diagnosis not present

## 2015-07-23 DIAGNOSIS — R262 Difficulty in walking, not elsewhere classified: Secondary | ICD-10-CM | POA: Diagnosis not present

## 2015-07-23 DIAGNOSIS — Z9181 History of falling: Secondary | ICD-10-CM | POA: Diagnosis not present

## 2015-07-24 DIAGNOSIS — Z9181 History of falling: Secondary | ICD-10-CM | POA: Diagnosis not present

## 2015-07-24 DIAGNOSIS — R262 Difficulty in walking, not elsewhere classified: Secondary | ICD-10-CM | POA: Diagnosis not present

## 2015-07-24 DIAGNOSIS — S32402D Unspecified fracture of left acetabulum, subsequent encounter for fracture with routine healing: Secondary | ICD-10-CM | POA: Diagnosis not present

## 2015-07-24 DIAGNOSIS — M6281 Muscle weakness (generalized): Secondary | ICD-10-CM | POA: Diagnosis not present

## 2015-07-26 DIAGNOSIS — R262 Difficulty in walking, not elsewhere classified: Secondary | ICD-10-CM | POA: Diagnosis not present

## 2015-07-26 DIAGNOSIS — Z9181 History of falling: Secondary | ICD-10-CM | POA: Diagnosis not present

## 2015-07-26 DIAGNOSIS — M6281 Muscle weakness (generalized): Secondary | ICD-10-CM | POA: Diagnosis not present

## 2015-07-26 DIAGNOSIS — S32402D Unspecified fracture of left acetabulum, subsequent encounter for fracture with routine healing: Secondary | ICD-10-CM | POA: Diagnosis not present

## 2015-07-27 DIAGNOSIS — M6281 Muscle weakness (generalized): Secondary | ICD-10-CM | POA: Diagnosis not present

## 2015-07-27 DIAGNOSIS — R262 Difficulty in walking, not elsewhere classified: Secondary | ICD-10-CM | POA: Diagnosis not present

## 2015-07-27 DIAGNOSIS — S32402D Unspecified fracture of left acetabulum, subsequent encounter for fracture with routine healing: Secondary | ICD-10-CM | POA: Diagnosis not present

## 2015-07-27 DIAGNOSIS — Z9181 History of falling: Secondary | ICD-10-CM | POA: Diagnosis not present

## 2015-07-28 DIAGNOSIS — S32402D Unspecified fracture of left acetabulum, subsequent encounter for fracture with routine healing: Secondary | ICD-10-CM | POA: Diagnosis not present

## 2015-07-28 DIAGNOSIS — R262 Difficulty in walking, not elsewhere classified: Secondary | ICD-10-CM | POA: Diagnosis not present

## 2015-07-28 DIAGNOSIS — Z9181 History of falling: Secondary | ICD-10-CM | POA: Diagnosis not present

## 2015-07-28 DIAGNOSIS — M6281 Muscle weakness (generalized): Secondary | ICD-10-CM | POA: Diagnosis not present

## 2015-08-05 NOTE — Progress Notes (Signed)
   06/20/15 1940  Restrictions  Weight Bearing Restrictions Yes  LLE Weight Bearing NWB  PT G-Codes **NOT FOR INPATIENT CLASS**  Functional Assessment Tool Used clinical judgement  Functional Limitation Mobility: Walking and moving around  Mobility: Walking and Moving Around Current Status [id number]) CK  Mobility: Walking and Moving Around Goal Status [phone]) CI  Kenyon Ana, PT Pager: [phone] 08/05/2015

## 2015-10-13 DIAGNOSIS — N401 Enlarged prostate with lower urinary tract symptoms: Secondary | ICD-10-CM | POA: Diagnosis not present

## 2015-10-13 DIAGNOSIS — Z8551 Personal history of malignant neoplasm of bladder: Secondary | ICD-10-CM | POA: Diagnosis not present

## 2015-10-13 DIAGNOSIS — R350 Frequency of micturition: Secondary | ICD-10-CM | POA: Diagnosis not present

## 2015-11-30 DIAGNOSIS — S32401S Unspecified fracture of right acetabulum, sequela: Secondary | ICD-10-CM | POA: Diagnosis not present

## 2015-11-30 DIAGNOSIS — M8588 Other specified disorders of bone density and structure, other site: Secondary | ICD-10-CM | POA: Diagnosis not present

## 2015-12-16 DIAGNOSIS — I480 Paroxysmal atrial fibrillation: Secondary | ICD-10-CM | POA: Diagnosis not present

## 2015-12-16 DIAGNOSIS — E7800 Pure hypercholesterolemia, unspecified: Secondary | ICD-10-CM | POA: Diagnosis not present

## 2015-12-16 DIAGNOSIS — Z0000 Encounter for general adult medical examination without abnormal findings: Secondary | ICD-10-CM | POA: Diagnosis not present

## 2015-12-16 DIAGNOSIS — Z79899 Other long term (current) drug therapy: Secondary | ICD-10-CM | POA: Diagnosis not present

## 2015-12-16 DIAGNOSIS — M85861 Other specified disorders of bone density and structure, right lower leg: Secondary | ICD-10-CM | POA: Diagnosis not present

## 2015-12-16 DIAGNOSIS — Z1389 Encounter for screening for other disorder: Secondary | ICD-10-CM | POA: Diagnosis not present

## 2015-12-16 DIAGNOSIS — I10 Essential (primary) hypertension: Secondary | ICD-10-CM | POA: Diagnosis not present

## 2015-12-16 DIAGNOSIS — I714 Abdominal aortic aneurysm, without rupture: Secondary | ICD-10-CM | POA: Diagnosis not present

## 2015-12-16 DIAGNOSIS — E039 Hypothyroidism, unspecified: Secondary | ICD-10-CM | POA: Diagnosis not present

## 2015-12-16 DIAGNOSIS — M85862 Other specified disorders of bone density and structure, left lower leg: Secondary | ICD-10-CM | POA: Diagnosis not present

## 2016-01-11 DIAGNOSIS — H35033 Hypertensive retinopathy, bilateral: Secondary | ICD-10-CM | POA: Diagnosis not present

## 2016-01-11 DIAGNOSIS — H02833 Dermatochalasis of right eye, unspecified eyelid: Secondary | ICD-10-CM | POA: Diagnosis not present

## 2016-01-11 DIAGNOSIS — H43393 Other vitreous opacities, bilateral: Secondary | ICD-10-CM | POA: Diagnosis not present

## 2016-01-11 DIAGNOSIS — Z961 Presence of intraocular lens: Secondary | ICD-10-CM | POA: Diagnosis not present

## 2016-03-10 DIAGNOSIS — J3489 Other specified disorders of nose and nasal sinuses: Secondary | ICD-10-CM | POA: Diagnosis not present

## 2016-03-10 DIAGNOSIS — R05 Cough: Secondary | ICD-10-CM | POA: Diagnosis not present

## 2016-03-10 DIAGNOSIS — R509 Fever, unspecified: Secondary | ICD-10-CM | POA: Diagnosis not present

## 2016-04-19 DIAGNOSIS — Z8551 Personal history of malignant neoplasm of bladder: Secondary | ICD-10-CM | POA: Diagnosis not present

## 2016-06-15 DIAGNOSIS — I10 Essential (primary) hypertension: Secondary | ICD-10-CM | POA: Diagnosis not present

## 2016-06-15 DIAGNOSIS — Z79899 Other long term (current) drug therapy: Secondary | ICD-10-CM | POA: Diagnosis not present

## 2016-06-15 DIAGNOSIS — I480 Paroxysmal atrial fibrillation: Secondary | ICD-10-CM | POA: Diagnosis not present

## 2016-08-10 DIAGNOSIS — I10 Essential (primary) hypertension: Secondary | ICD-10-CM | POA: Diagnosis not present

## 2016-08-10 DIAGNOSIS — E669 Obesity, unspecified: Secondary | ICD-10-CM | POA: Diagnosis not present

## 2016-08-10 DIAGNOSIS — I480 Paroxysmal atrial fibrillation: Secondary | ICD-10-CM | POA: Diagnosis not present

## 2016-08-10 DIAGNOSIS — I714 Abdominal aortic aneurysm, without rupture: Secondary | ICD-10-CM | POA: Diagnosis not present

## 2016-10-19 DIAGNOSIS — Z8551 Personal history of malignant neoplasm of bladder: Secondary | ICD-10-CM | POA: Diagnosis not present

## 2016-10-19 DIAGNOSIS — N390 Urinary tract infection, site not specified: Secondary | ICD-10-CM | POA: Diagnosis not present

## 2016-11-08 DIAGNOSIS — Z8551 Personal history of malignant neoplasm of bladder: Secondary | ICD-10-CM | POA: Diagnosis not present

## 2016-11-08 DIAGNOSIS — N390 Urinary tract infection, site not specified: Secondary | ICD-10-CM | POA: Diagnosis not present

## 2016-11-08 DIAGNOSIS — D494 Neoplasm of unspecified behavior of bladder: Secondary | ICD-10-CM | POA: Diagnosis not present

## 2016-11-08 DIAGNOSIS — A499 Bacterial infection, unspecified: Secondary | ICD-10-CM | POA: Diagnosis not present

## 2016-11-30 DIAGNOSIS — Z23 Encounter for immunization: Secondary | ICD-10-CM | POA: Diagnosis not present

## 2016-12-01 DIAGNOSIS — D649 Anemia, unspecified: Secondary | ICD-10-CM | POA: Diagnosis not present

## 2016-12-01 DIAGNOSIS — I10 Essential (primary) hypertension: Secondary | ICD-10-CM | POA: Diagnosis not present

## 2016-12-01 DIAGNOSIS — I4891 Unspecified atrial fibrillation: Secondary | ICD-10-CM | POA: Diagnosis not present

## 2016-12-01 DIAGNOSIS — D494 Neoplasm of unspecified behavior of bladder: Secondary | ICD-10-CM | POA: Diagnosis not present

## 2016-12-01 DIAGNOSIS — Z8673 Personal history of transient ischemic attack (TIA), and cerebral infarction without residual deficits: Secondary | ICD-10-CM | POA: Diagnosis not present

## 2016-12-01 DIAGNOSIS — M109 Gout, unspecified: Secondary | ICD-10-CM | POA: Diagnosis not present

## 2016-12-01 DIAGNOSIS — E039 Hypothyroidism, unspecified: Secondary | ICD-10-CM | POA: Diagnosis not present

## 2016-12-01 DIAGNOSIS — E782 Mixed hyperlipidemia: Secondary | ICD-10-CM | POA: Diagnosis not present

## 2016-12-01 DIAGNOSIS — I456 Pre-excitation syndrome: Secondary | ICD-10-CM | POA: Diagnosis not present

## 2016-12-01 DIAGNOSIS — Z01818 Encounter for other preprocedural examination: Secondary | ICD-10-CM | POA: Diagnosis not present

## 2016-12-01 DIAGNOSIS — I714 Abdominal aortic aneurysm, without rupture: Secondary | ICD-10-CM | POA: Diagnosis not present

## 2016-12-01 DIAGNOSIS — R413 Other amnesia: Secondary | ICD-10-CM | POA: Diagnosis not present

## 2016-12-05 DIAGNOSIS — Z87891 Personal history of nicotine dependence: Secondary | ICD-10-CM | POA: Diagnosis not present

## 2016-12-05 DIAGNOSIS — I456 Pre-excitation syndrome: Secondary | ICD-10-CM | POA: Diagnosis not present

## 2016-12-05 DIAGNOSIS — M109 Gout, unspecified: Secondary | ICD-10-CM | POA: Diagnosis not present

## 2016-12-05 DIAGNOSIS — I10 Essential (primary) hypertension: Secondary | ICD-10-CM | POA: Diagnosis not present

## 2016-12-05 DIAGNOSIS — E782 Mixed hyperlipidemia: Secondary | ICD-10-CM | POA: Diagnosis not present

## 2016-12-05 DIAGNOSIS — Z79899 Other long term (current) drug therapy: Secondary | ICD-10-CM | POA: Diagnosis not present

## 2016-12-05 DIAGNOSIS — Q625 Duplication of ureter: Secondary | ICD-10-CM | POA: Diagnosis not present

## 2016-12-05 DIAGNOSIS — I4891 Unspecified atrial fibrillation: Secondary | ICD-10-CM | POA: Diagnosis not present

## 2016-12-05 DIAGNOSIS — R413 Other amnesia: Secondary | ICD-10-CM | POA: Diagnosis not present

## 2016-12-05 DIAGNOSIS — I69398 Other sequelae of cerebral infarction: Secondary | ICD-10-CM | POA: Diagnosis not present

## 2016-12-05 DIAGNOSIS — Z8781 Personal history of (healed) traumatic fracture: Secondary | ICD-10-CM | POA: Diagnosis not present

## 2016-12-05 DIAGNOSIS — N401 Enlarged prostate with lower urinary tract symptoms: Secondary | ICD-10-CM | POA: Diagnosis not present

## 2016-12-05 DIAGNOSIS — N3000 Acute cystitis without hematuria: Secondary | ICD-10-CM | POA: Diagnosis not present

## 2016-12-05 DIAGNOSIS — R269 Unspecified abnormalities of gait and mobility: Secondary | ICD-10-CM | POA: Diagnosis not present

## 2016-12-05 DIAGNOSIS — Z8551 Personal history of malignant neoplasm of bladder: Secondary | ICD-10-CM | POA: Diagnosis not present

## 2016-12-05 DIAGNOSIS — D649 Anemia, unspecified: Secondary | ICD-10-CM | POA: Diagnosis not present

## 2016-12-05 DIAGNOSIS — R338 Other retention of urine: Secondary | ICD-10-CM | POA: Diagnosis not present

## 2016-12-05 DIAGNOSIS — Z9181 History of falling: Secondary | ICD-10-CM | POA: Diagnosis not present

## 2016-12-05 DIAGNOSIS — E039 Hypothyroidism, unspecified: Secondary | ICD-10-CM | POA: Diagnosis not present

## 2016-12-05 DIAGNOSIS — Z7901 Long term (current) use of anticoagulants: Secondary | ICD-10-CM | POA: Diagnosis not present

## 2016-12-05 DIAGNOSIS — Z791 Long term (current) use of non-steroidal anti-inflammatories (NSAID): Secondary | ICD-10-CM | POA: Diagnosis not present

## 2016-12-05 DIAGNOSIS — D494 Neoplasm of unspecified behavior of bladder: Secondary | ICD-10-CM | POA: Diagnosis not present

## 2016-12-05 DIAGNOSIS — N323 Diverticulum of bladder: Secondary | ICD-10-CM | POA: Diagnosis not present

## 2016-12-05 DIAGNOSIS — I714 Abdominal aortic aneurysm, without rupture: Secondary | ICD-10-CM | POA: Diagnosis not present

## 2016-12-17 IMAGING — CR DG HIP (WITH OR WITHOUT PELVIS) 2-3V*L*
3 series · 3 of 3 positions shown · non-contrast
Comparison: None.

CLINICAL DATA: Status post fall 2 days ago.  Left hip pain.

EXAM:
DG HIP (WITH OR WITHOUT PELVIS) 2-3V LEFT

[t hip ap left]
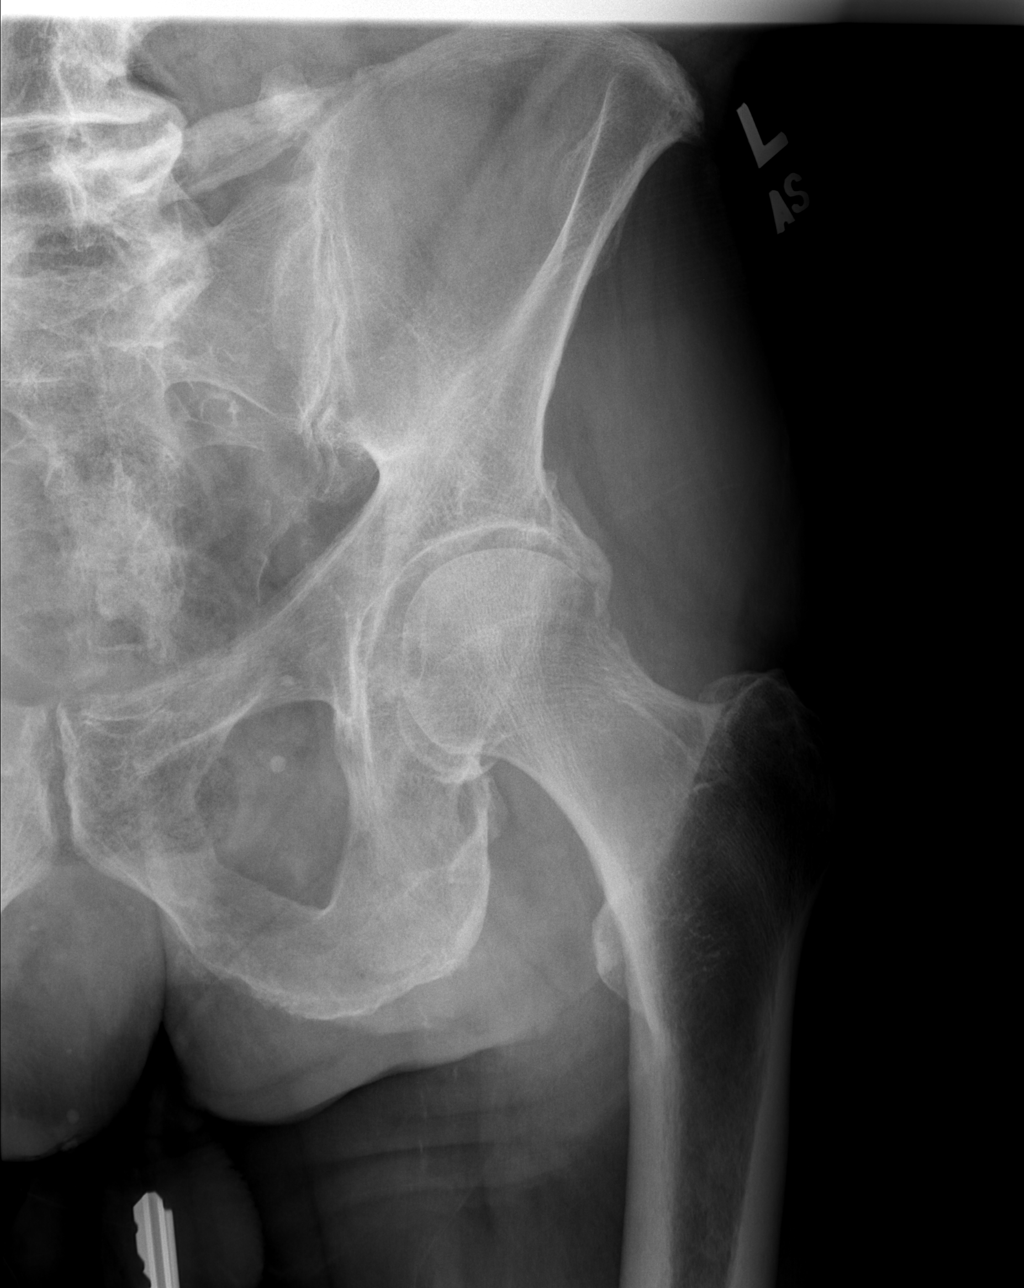

[t hip frog leg left (1 of 2)]
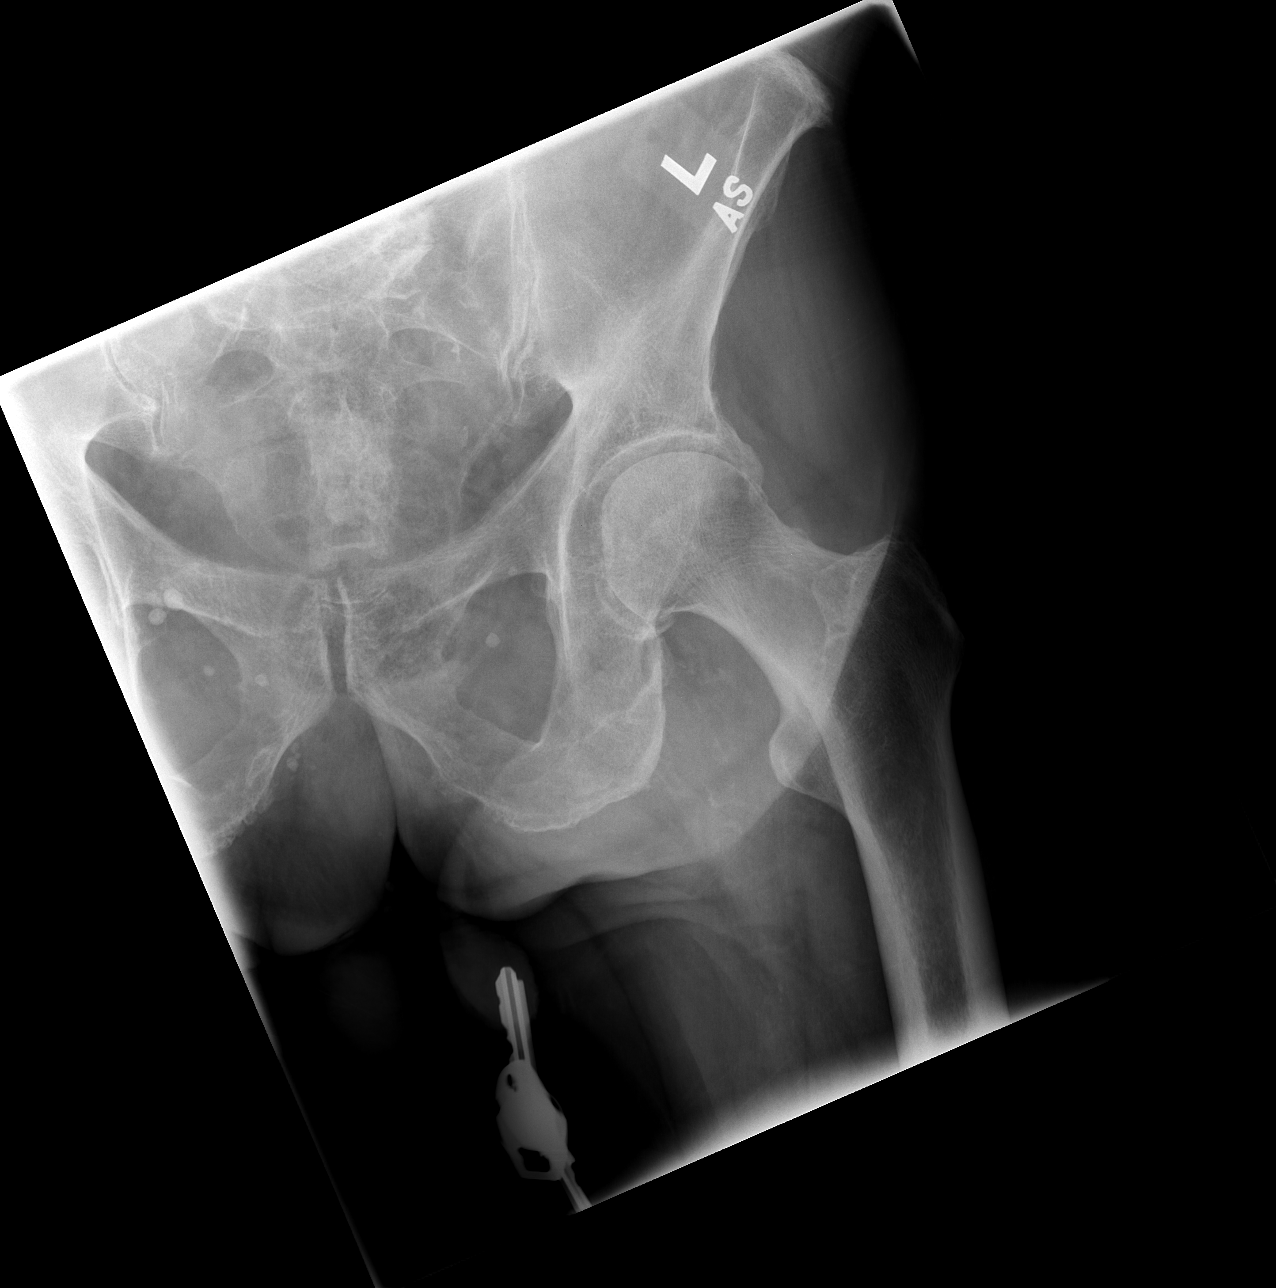

[t hip frog leg left (2 of 2)]
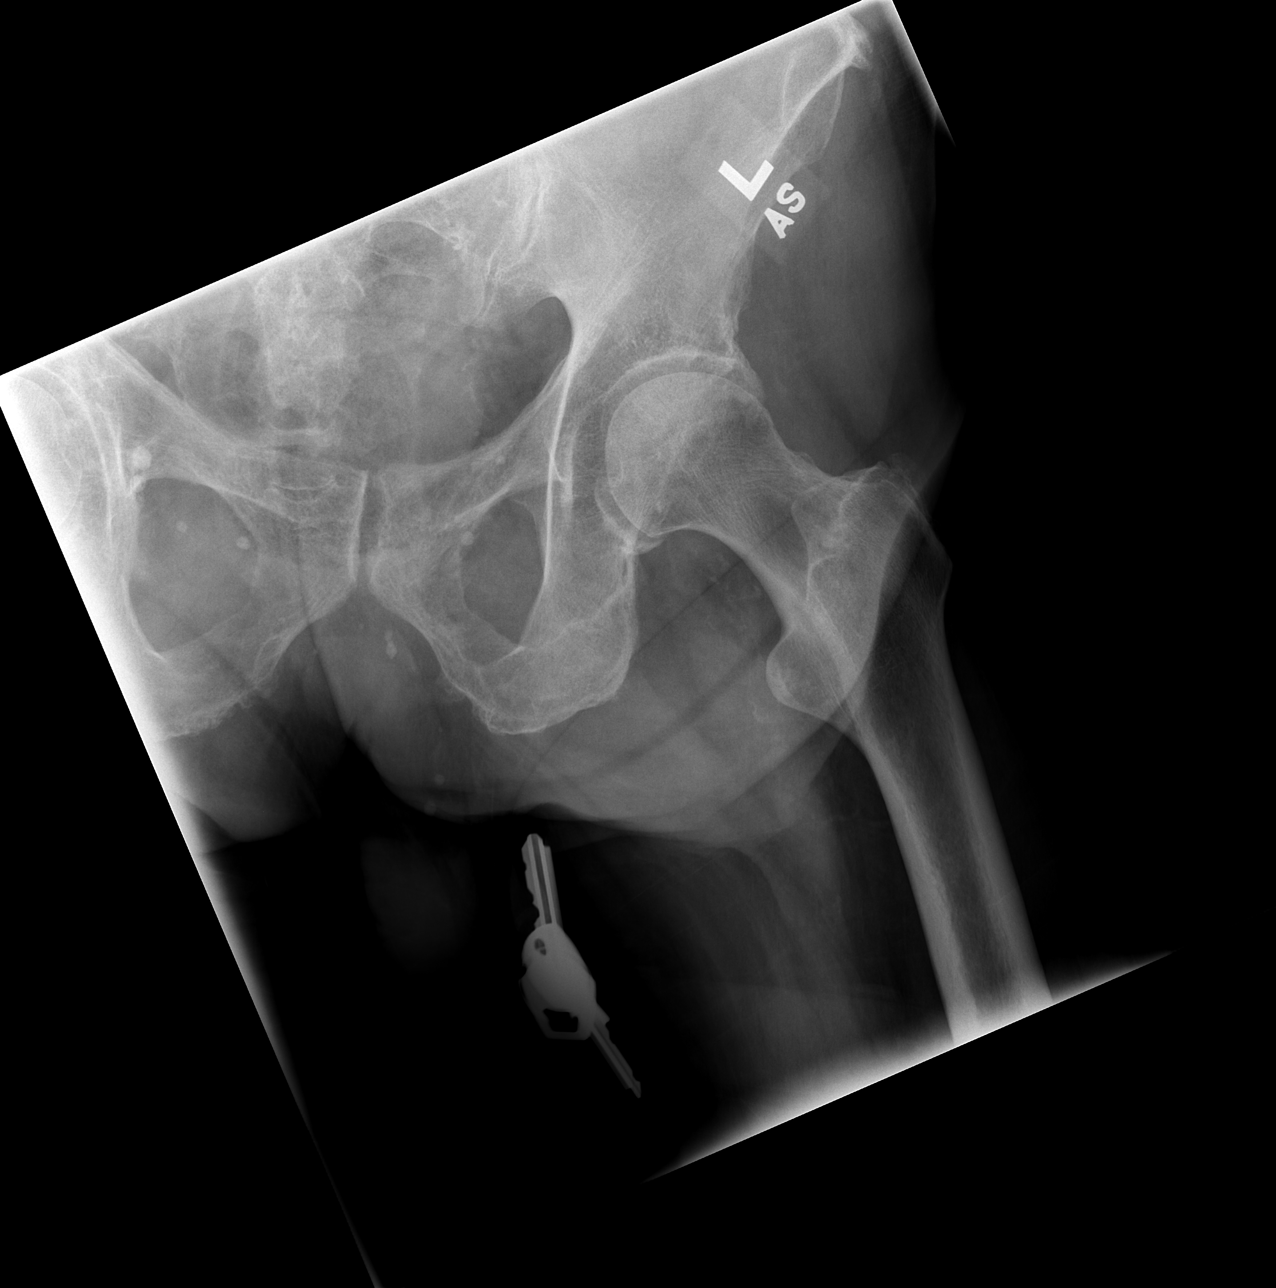

[3 of 3 positions shown; findings below may reference images not displayed]

FINDINGS: Significant degenerative changes are identified with no acute
fractures.
IMPRESSION: No acute abnormality.  Degenerative change.

## 2017-01-11 DIAGNOSIS — E039 Hypothyroidism, unspecified: Secondary | ICD-10-CM | POA: Diagnosis not present

## 2017-01-11 DIAGNOSIS — Z79899 Other long term (current) drug therapy: Secondary | ICD-10-CM | POA: Diagnosis not present

## 2017-01-11 DIAGNOSIS — E7800 Pure hypercholesterolemia, unspecified: Secondary | ICD-10-CM | POA: Diagnosis not present

## 2017-01-11 DIAGNOSIS — I480 Paroxysmal atrial fibrillation: Secondary | ICD-10-CM | POA: Diagnosis not present

## 2017-01-11 DIAGNOSIS — I10 Essential (primary) hypertension: Secondary | ICD-10-CM | POA: Diagnosis not present

## 2017-01-11 DIAGNOSIS — I714 Abdominal aortic aneurysm, without rupture: Secondary | ICD-10-CM | POA: Diagnosis not present

## 2017-01-11 DIAGNOSIS — Z6834 Body mass index (BMI) 34.0-34.9, adult: Secondary | ICD-10-CM | POA: Diagnosis not present

## 2017-01-11 DIAGNOSIS — Z0000 Encounter for general adult medical examination without abnormal findings: Secondary | ICD-10-CM | POA: Diagnosis not present

## 2017-01-11 DIAGNOSIS — Z1389 Encounter for screening for other disorder: Secondary | ICD-10-CM | POA: Diagnosis not present

## 2017-01-11 DIAGNOSIS — E669 Obesity, unspecified: Secondary | ICD-10-CM | POA: Diagnosis not present

## 2017-01-12 DIAGNOSIS — Z961 Presence of intraocular lens: Secondary | ICD-10-CM | POA: Diagnosis not present

## 2017-01-12 DIAGNOSIS — H353131 Nonexudative age-related macular degeneration, bilateral, early dry stage: Secondary | ICD-10-CM | POA: Diagnosis not present

## 2017-01-12 DIAGNOSIS — H35371 Puckering of macula, right eye: Secondary | ICD-10-CM | POA: Diagnosis not present

## 2017-01-12 DIAGNOSIS — H35033 Hypertensive retinopathy, bilateral: Secondary | ICD-10-CM | POA: Diagnosis not present

## 2017-03-13 IMAGING — DX DG CHEST 1V PORT
1 series · 1 of 1 positions shown · non-contrast
Comparison: MRI 06/19/2015

CLINICAL DATA: Nondisplaced acetabular fracture

EXAM:
PORTABLE CHEST 1 VIEW

[chest ap]
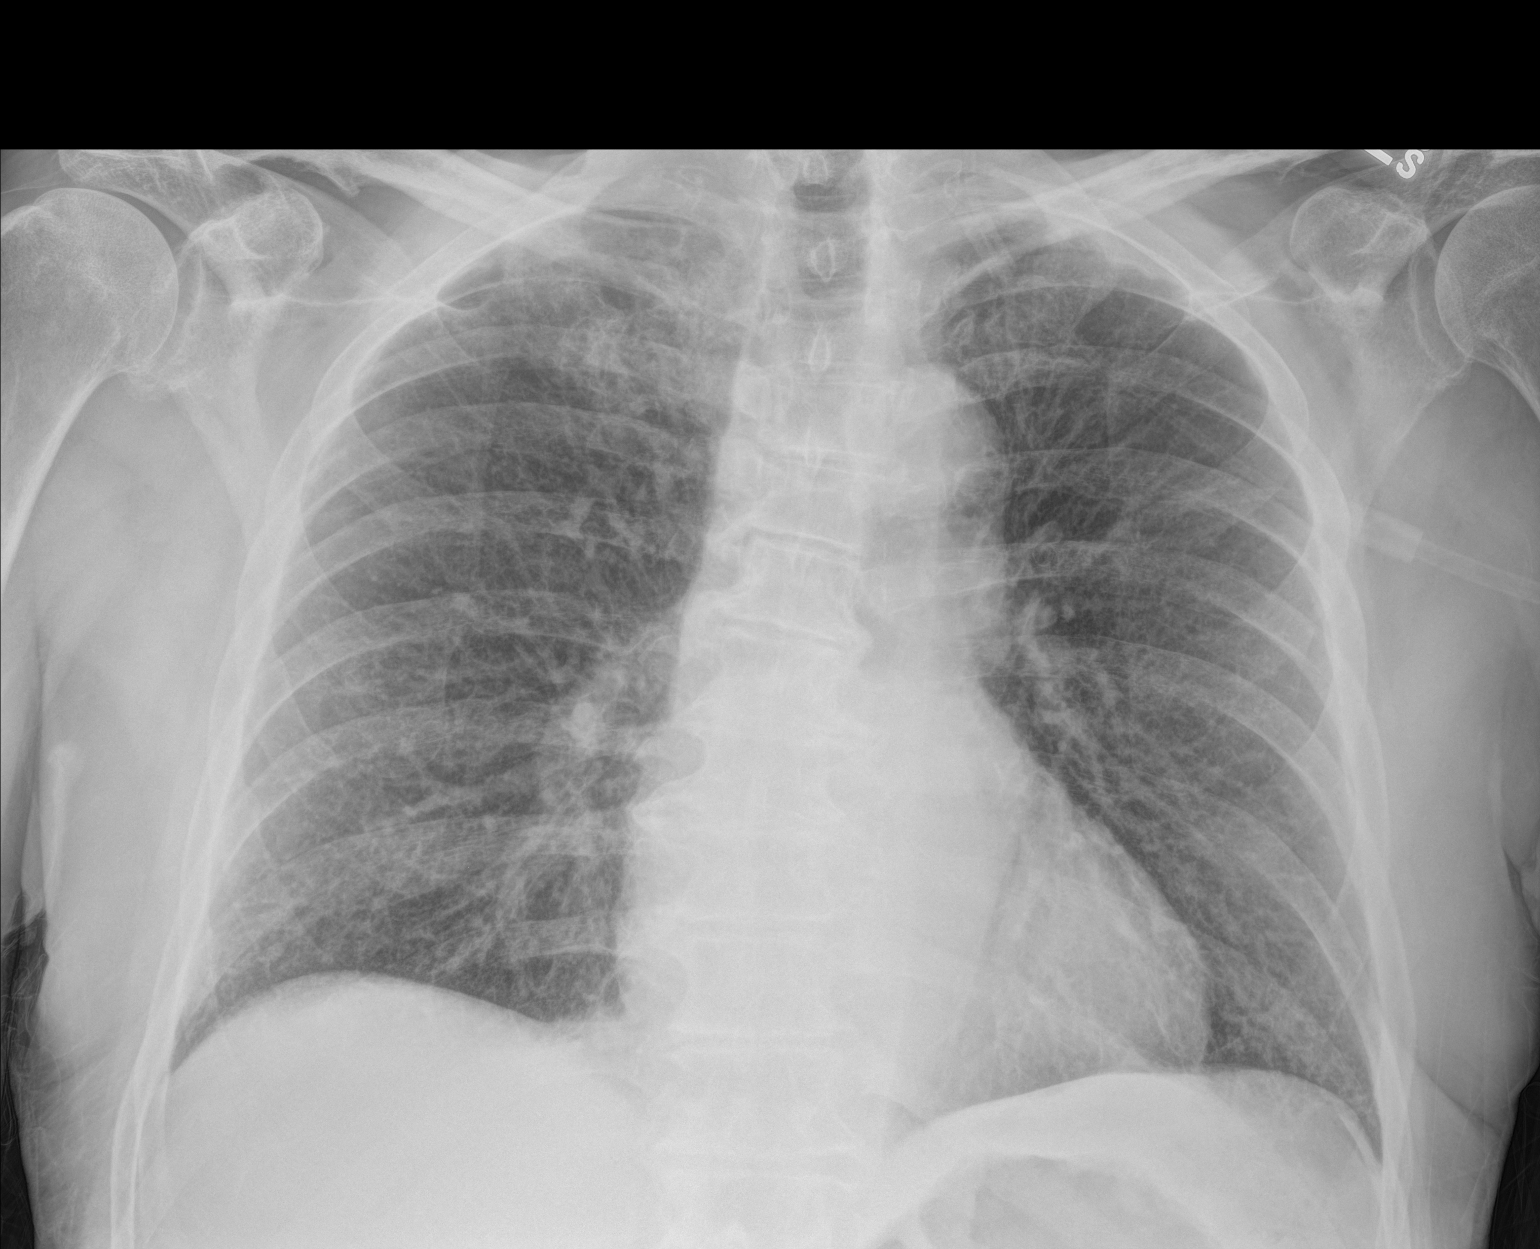

[1 of 1 positions shown; findings below may reference images not displayed]

FINDINGS: Normal cardiac silhouette. Chronic appearing interstitial markings
in the lungs. No pulmonary edema. No new infiltrate pneumothorax.
Degenerate spurring of the spine.
IMPRESSION: Chronic interstitial markings.  No acute findings.

## 2017-03-14 DIAGNOSIS — Z8551 Personal history of malignant neoplasm of bladder: Secondary | ICD-10-CM | POA: Diagnosis not present

## 2017-03-14 DIAGNOSIS — R350 Frequency of micturition: Secondary | ICD-10-CM | POA: Diagnosis not present

## 2017-03-14 DIAGNOSIS — N401 Enlarged prostate with lower urinary tract symptoms: Secondary | ICD-10-CM | POA: Diagnosis not present

## 2017-05-10 DIAGNOSIS — I10 Essential (primary) hypertension: Secondary | ICD-10-CM | POA: Diagnosis not present

## 2017-05-10 DIAGNOSIS — I480 Paroxysmal atrial fibrillation: Secondary | ICD-10-CM | POA: Diagnosis not present

## 2017-05-24 DIAGNOSIS — I10 Essential (primary) hypertension: Secondary | ICD-10-CM | POA: Diagnosis not present

## 2017-05-24 DIAGNOSIS — L89220 Pressure ulcer of left hip, unstageable: Secondary | ICD-10-CM | POA: Diagnosis not present

## 2017-05-24 DIAGNOSIS — I480 Paroxysmal atrial fibrillation: Secondary | ICD-10-CM | POA: Diagnosis not present

## 2017-07-05 DIAGNOSIS — I10 Essential (primary) hypertension: Secondary | ICD-10-CM | POA: Diagnosis not present

## 2017-07-05 DIAGNOSIS — I480 Paroxysmal atrial fibrillation: Secondary | ICD-10-CM | POA: Diagnosis not present

## 2017-07-05 DIAGNOSIS — L89220 Pressure ulcer of left hip, unstageable: Secondary | ICD-10-CM | POA: Diagnosis not present

## 2017-09-13 DIAGNOSIS — N401 Enlarged prostate with lower urinary tract symptoms: Secondary | ICD-10-CM | POA: Diagnosis not present

## 2017-09-13 DIAGNOSIS — Z8551 Personal history of malignant neoplasm of bladder: Secondary | ICD-10-CM | POA: Diagnosis not present

## 2017-09-13 DIAGNOSIS — R350 Frequency of micturition: Secondary | ICD-10-CM | POA: Diagnosis not present

## 2017-10-31 DIAGNOSIS — Z8551 Personal history of malignant neoplasm of bladder: Secondary | ICD-10-CM | POA: Diagnosis not present

## 2017-11-03 ENCOUNTER — Other Ambulatory Visit: Payer: Self-pay

## 2017-11-03 ENCOUNTER — Emergency Department (HOSPITAL_COMMUNITY): Payer: Medicare Other

## 2017-11-03 ENCOUNTER — Emergency Department (HOSPITAL_COMMUNITY)
Admission: EM | Admit: 2017-11-03 | Discharge: 2017-11-03 | Disposition: A | Discharge status: Skilled Nursing Facility | Payer: Medicare Other | Attending: Emergency Medicine | Admitting: Emergency Medicine

## 2017-11-03 ENCOUNTER — Encounter (HOSPITAL_COMMUNITY): Payer: Self-pay

## 2017-11-03 DIAGNOSIS — E039 Hypothyroidism, unspecified: Secondary | ICD-10-CM | POA: Insufficient documentation

## 2017-11-03 DIAGNOSIS — M179 Osteoarthritis of knee, unspecified: Secondary | ICD-10-CM | POA: Diagnosis not present

## 2017-11-03 DIAGNOSIS — Z8551 Personal history of malignant neoplasm of bladder: Secondary | ICD-10-CM | POA: Insufficient documentation

## 2017-11-03 DIAGNOSIS — M25561 Pain in right knee: Secondary | ICD-10-CM | POA: Diagnosis not present

## 2017-11-03 DIAGNOSIS — Z79899 Other long term (current) drug therapy: Secondary | ICD-10-CM | POA: Insufficient documentation

## 2017-11-03 DIAGNOSIS — I10 Essential (primary) hypertension: Secondary | ICD-10-CM | POA: Diagnosis not present

## 2017-11-03 DIAGNOSIS — M1711 Unilateral primary osteoarthritis, right knee: Secondary | ICD-10-CM

## 2017-11-03 DIAGNOSIS — M25461 Effusion, right knee: Secondary | ICD-10-CM | POA: Insufficient documentation

## 2017-11-03 DIAGNOSIS — M2550 Pain in unspecified joint: Secondary | ICD-10-CM | POA: Diagnosis not present

## 2017-11-03 DIAGNOSIS — Z7401 Bed confinement status: Secondary | ICD-10-CM | POA: Diagnosis not present

## 2017-11-03 DIAGNOSIS — R0902 Hypoxemia: Secondary | ICD-10-CM | POA: Diagnosis not present

## 2017-11-03 MED ORDER — HYDROCODONE-ACETAMINOPHEN 5-325 MG PO TABS: 1.0000 | ORAL_TABLET | Freq: Four times a day (QID) | ORAL | 0 refills | Status: DC | PRN

## 2017-11-03 NOTE — ED Notes (Signed)
ED Provider at bedside. 

## 2017-11-03 NOTE — ED Triage Notes (Signed)
He c/o right knee pain which began two days ago. He cites surgery on his right knee ~10 years ago. He arrives with an articulating hard splint on his right knee. He tells [place] that whenever he attempts to ambulate "it hurts too bad to walk" [sic]

## 2017-11-03 NOTE — ED Notes (Signed)
[facility] has been called and will send transport ASAP.

## 2017-11-03 NOTE — ED Provider Notes (Signed)
[place] DEPT Provider Note   CSN: [id number] Arrival date & time: 11/03/17  1511     History   Chief Complaint Chief Complaint  Patient presents with  . Knee Pain    HPI Samuel Shaffer is a 84 y.o. male.  84 year old male presents with complaint of right knee pain x2 days.  Patient states that he had arthroscopic surgery 20 years ago for a torn meniscus, denies any other problems with his knees since then, no history of injuries or falls.  Patient states that he is pain-free while resting, experiences diffuse right knee pain with bearing weight or with flexion past 90 degrees.  Denies redness or swelling.  Pain does not radiate.  Patient is wearing a brace on his knee without relief.  No other complaints or concerns.     Past Medical History:  Diagnosis Date  . A-fib ([place])   . Bladder cancer [facility])    s/p resection  . Gout   . History of GI diverticular bleed   . Hyperlipidemia   . Hypertension   . Hypothyroidism   . Stroke [facility])     Patient Active Problem List   Diagnosis Date Noted  . Left acetabular fracture, closed, initial encounter 06/21/2015  . Essential hypertension 06/20/2015  . A-fib ([place]) 06/19/2015  . Closed left acetabular fracture ([place]) 06/19/2015  . Wolff-Parkinson-White (WPW) pattern 06/19/2015  . Hyperlipidemia   . Hypothyroidism   . Gout     Past Surgical History:  Procedure Laterality Date  . CHOLECYSTECTOMY    . KNEE SURGERY          Home Medications    Prior to Admission medications   Medication Sig Start Date End Date Taking? Authorizing Provider  allopurinol (ZYLOPRIM) 300 MG tablet Take 300 mg by mouth daily. 05/31/15   [provider]  atorvastatin (LIPITOR) 40 MG tablet Take 40 mg by mouth daily.    [provider]  diltiazem (DILACOR XR) 240 MG 24 hr capsule Take 240 mg by mouth daily.    [provider]  ELIQUIS 2.5 MG TABS tablet Take 2.5 mg by mouth 2 (two) times  daily.  05/27/15   [provider]  HYDROcodone-acetaminophen (NORCO/VICODIN) 5-325 MG tablet Take 1 tablet by mouth every 6 (six) hours as needed for moderate pain. 11/03/17   Tacy Learn, PA-C  levothyroxine (SYNTHROID) 125 MCG tablet Take 125 mcg by mouth daily. 06/16/12   [provider]  losartan (COZAAR) 50 MG tablet Take 50 mg by mouth daily. 05/31/15   [provider]  Vitamin D, Cholecalciferol, 400 units CAPS Take 400 Units by mouth daily.    [provider]    Family History No family history on file.  Social History Social History   Tobacco Use  . Smoking status: Never Smoker  . Smokeless tobacco: Never Used  Substance Use Topics  . Alcohol use: Yes    Alcohol/week: 1.0 standard drinks    Types: 1 Glasses of wine per week    Comment: 1 glass of wine every Fri. evening.  . Drug use: Not on file     Allergies   Patient has no known allergies.   Review of Systems Review of Systems  Constitutional: Negative for fever.  Musculoskeletal: Positive for arthralgias and gait problem.  Skin: Negative for rash and wound.  Neurological: Negative for weakness and numbness.  Hematological: Bruises/bleeds easily.  Psychiatric/Behavioral: Negative for confusion.  All other systems reviewed and are  negative.    Physical Exam Updated Vital Signs BP 120/65 (BP Location: Right Arm)   Pulse 64   Temp 98.1 F (36.7 C) (Oral)   Resp 16   SpO2 94%   Physical Exam  Constitutional: He is oriented to person, place, and time. He appears well-developed and well-nourished. No distress.  HENT:  Head: Normocephalic and atraumatic.  Cardiovascular: Intact distal pulses.  Pulmonary/Chest: Effort normal.  Musculoskeletal: He exhibits no tenderness or deformity.       Right knee: He exhibits normal range of motion, no effusion, normal alignment, no LCL laxity, no bony tenderness and no MCL laxity. No tenderness found.       Legs: Neurological: He is  alert and oriented to person, place, and time.  Skin: Skin is warm and dry. No rash noted. He is not diaphoretic. No erythema.  Psychiatric: He has a normal mood and affect. His behavior is normal.  Nursing note and vitals reviewed.    ED Treatments / Results  Labs (all labs ordered are listed, but only abnormal results are displayed) Labs Reviewed - No data to display  EKG None  Radiology Dg Knee Complete 4 Views Right  Result Date: 11/03/2017 CLINICAL DATA:  Right knee pain for the past 2 days. No reported injury. Previous right knee surgery approximately 10 years ago EXAM: RIGHT KNEE - COMPLETE 4+ VIEW COMPARISON:  None. FINDINGS: Moderate lateral joint space narrowing and associated spur formation. Moderate patellofemoral spur formation. Minimal medial spur formation. Moderate-sized effusion. Extensive arterial calcifications. Anterior patellar enthesophytes. Posterior loose body. IMPRESSION: Degenerative changes, moderate-sized effusion and loose body, as described above. Electronically Signed   By: Claudie Revering M.D.   On: 11/03/2017 17:38    Procedures Procedures (including critical care time)  Medications Ordered in ED Medications - No data to display   Initial Impression / Assessment and Plan / ED Course  I have reviewed the triage vital signs and the nursing notes.  Pertinent labs & imaging results that were available during my care of the patient were reviewed by me and considered in my medical decision making (see chart for details).  Clinical Course as of Nov 03 1809  Sat Nov 04, 1210  2562 84 year old male with right knee pain.  Patient states on Thursday he noticed that he was having pain in the right knee with bearing weight, denies falls or injury.  On exam there is no erythema, no appreciable swelling, she is able to fully extend leg, has pain with flexion beyond 90 degrees.  X-ray shows effusion with osteoarthritis and loose body.  Discussed results with patient,  referral given to orthopedics although he states he has made an appointment with an orthopedic on the upcoming Thursday.  Patient is given prescription for Norco to take for his pain, no recent prescriptions filled on the controlled substance database.  Patient verbalizes understanding of discharge instructions and plan.   [LM]    Clinical Course User Index [LM] Tacy Learn, PA-C    Final Clinical Impressions(s) / ED Diagnoses   Final diagnoses:  Effusion of right knee  Osteoarthritis of right knee, unspecified osteoarthritis type    ED Discharge Orders         Ordered    HYDROcodone-acetaminophen (NORCO/VICODIN) 5-325 MG tablet  Every 6 hours PRN     11/03/17 1757           Tacy Learn, PA-C 11/03/17 1811    Little, Wenda Overland, MD 11/04/17 1510

## 2017-11-03 NOTE — Discharge Instructions (Addendum)
Take Norco as needed as prescribed for pain. Follow up with orthopedics, referral given if needed.

## 2017-11-03 NOTE — ED Notes (Signed)
Bed: [facility] Expected date:  Expected time:  Means of arrival:  Comments: EMS 84y/o knee pain

## 2017-11-05 DIAGNOSIS — I10 Essential (primary) hypertension: Secondary | ICD-10-CM | POA: Diagnosis not present

## 2017-11-05 DIAGNOSIS — I4891 Unspecified atrial fibrillation: Secondary | ICD-10-CM | POA: Diagnosis not present

## 2017-11-05 DIAGNOSIS — M6281 Muscle weakness (generalized): Secondary | ICD-10-CM | POA: Diagnosis not present

## 2017-11-05 DIAGNOSIS — M810 Age-related osteoporosis without current pathological fracture: Secondary | ICD-10-CM | POA: Diagnosis not present

## 2017-11-05 DIAGNOSIS — M109 Gout, unspecified: Secondary | ICD-10-CM | POA: Diagnosis not present

## 2017-11-05 DIAGNOSIS — M25561 Pain in right knee: Secondary | ICD-10-CM | POA: Diagnosis not present

## 2017-11-05 DIAGNOSIS — E039 Hypothyroidism, unspecified: Secondary | ICD-10-CM | POA: Diagnosis not present

## 2017-11-05 DIAGNOSIS — E785 Hyperlipidemia, unspecified: Secondary | ICD-10-CM | POA: Diagnosis not present

## 2017-11-08 DIAGNOSIS — M1711 Unilateral primary osteoarthritis, right knee: Secondary | ICD-10-CM | POA: Diagnosis not present

## 2017-12-25 DIAGNOSIS — Z23 Encounter for immunization: Secondary | ICD-10-CM | POA: Diagnosis not present

## 2018-01-10 DIAGNOSIS — Z79899 Other long term (current) drug therapy: Secondary | ICD-10-CM | POA: Diagnosis not present

## 2018-01-10 DIAGNOSIS — I714 Abdominal aortic aneurysm, without rupture: Secondary | ICD-10-CM | POA: Diagnosis not present

## 2018-01-10 DIAGNOSIS — I480 Paroxysmal atrial fibrillation: Secondary | ICD-10-CM | POA: Diagnosis not present

## 2018-01-10 DIAGNOSIS — Z1389 Encounter for screening for other disorder: Secondary | ICD-10-CM | POA: Diagnosis not present

## 2018-01-10 DIAGNOSIS — D090 Carcinoma in situ of bladder: Secondary | ICD-10-CM | POA: Diagnosis not present

## 2018-01-10 DIAGNOSIS — E039 Hypothyroidism, unspecified: Secondary | ICD-10-CM | POA: Diagnosis not present

## 2018-01-10 DIAGNOSIS — Z23 Encounter for immunization: Secondary | ICD-10-CM | POA: Diagnosis not present

## 2018-01-10 DIAGNOSIS — I10 Essential (primary) hypertension: Secondary | ICD-10-CM | POA: Diagnosis not present

## 2018-01-10 DIAGNOSIS — M810 Age-related osteoporosis without current pathological fracture: Secondary | ICD-10-CM | POA: Diagnosis not present

## 2018-01-10 DIAGNOSIS — Z0000 Encounter for general adult medical examination without abnormal findings: Secondary | ICD-10-CM | POA: Diagnosis not present

## 2018-01-10 DIAGNOSIS — E7800 Pure hypercholesterolemia, unspecified: Secondary | ICD-10-CM | POA: Diagnosis not present

## 2018-01-21 DIAGNOSIS — M25561 Pain in right knee: Secondary | ICD-10-CM | POA: Diagnosis not present

## 2018-01-21 DIAGNOSIS — M1711 Unilateral primary osteoarthritis, right knee: Secondary | ICD-10-CM | POA: Diagnosis not present

## 2018-02-07 DIAGNOSIS — H35371 Puckering of macula, right eye: Secondary | ICD-10-CM | POA: Diagnosis not present

## 2018-02-07 DIAGNOSIS — H353131 Nonexudative age-related macular degeneration, bilateral, early dry stage: Secondary | ICD-10-CM | POA: Diagnosis not present

## 2018-02-07 DIAGNOSIS — Z961 Presence of intraocular lens: Secondary | ICD-10-CM | POA: Diagnosis not present

## 2018-02-07 DIAGNOSIS — H35033 Hypertensive retinopathy, bilateral: Secondary | ICD-10-CM | POA: Diagnosis not present

## 2018-03-20 DIAGNOSIS — M1711 Unilateral primary osteoarthritis, right knee: Secondary | ICD-10-CM | POA: Diagnosis not present

## 2018-04-16 DIAGNOSIS — H35033 Hypertensive retinopathy, bilateral: Secondary | ICD-10-CM | POA: Diagnosis not present

## 2018-04-16 DIAGNOSIS — I480 Paroxysmal atrial fibrillation: Secondary | ICD-10-CM | POA: Diagnosis not present

## 2018-04-16 DIAGNOSIS — I10 Essential (primary) hypertension: Secondary | ICD-10-CM | POA: Diagnosis not present

## 2018-04-16 DIAGNOSIS — R269 Unspecified abnormalities of gait and mobility: Secondary | ICD-10-CM | POA: Diagnosis not present

## 2018-04-22 DIAGNOSIS — E039 Hypothyroidism, unspecified: Secondary | ICD-10-CM | POA: Diagnosis not present

## 2018-07-11 DIAGNOSIS — I714 Abdominal aortic aneurysm, without rupture: Secondary | ICD-10-CM | POA: Diagnosis not present

## 2018-07-11 DIAGNOSIS — H35033 Hypertensive retinopathy, bilateral: Secondary | ICD-10-CM | POA: Diagnosis not present

## 2018-07-11 DIAGNOSIS — I480 Paroxysmal atrial fibrillation: Secondary | ICD-10-CM | POA: Diagnosis not present

## 2018-07-11 DIAGNOSIS — E039 Hypothyroidism, unspecified: Secondary | ICD-10-CM | POA: Diagnosis not present

## 2018-07-11 DIAGNOSIS — M25561 Pain in right knee: Secondary | ICD-10-CM | POA: Diagnosis not present

## 2018-07-25 DIAGNOSIS — Z79899 Other long term (current) drug therapy: Secondary | ICD-10-CM | POA: Diagnosis not present

## 2018-07-25 DIAGNOSIS — E039 Hypothyroidism, unspecified: Secondary | ICD-10-CM | POA: Diagnosis not present

## 2018-07-31 DIAGNOSIS — M1711 Unilateral primary osteoarthritis, right knee: Secondary | ICD-10-CM | POA: Diagnosis not present

## 2018-07-31 DIAGNOSIS — M25561 Pain in right knee: Secondary | ICD-10-CM | POA: Diagnosis not present

## 2018-09-10 DIAGNOSIS — Z8551 Personal history of malignant neoplasm of bladder: Secondary | ICD-10-CM | POA: Diagnosis not present

## 2018-09-16 ENCOUNTER — Other Ambulatory Visit (HOSPITAL_COMMUNITY): Payer: Self-pay | Admitting: Geriatric Medicine

## 2018-09-16 ENCOUNTER — Ambulatory Visit (HOSPITAL_COMMUNITY)
Admission: RE | Admit: 2018-09-16 | Discharge: 2018-09-16 | Disposition: A | Discharge status: Home / Self Care | Payer: Medicare Other | Source: Ambulatory Visit | Attending: Geriatric Medicine | Admitting: Geriatric Medicine

## 2018-09-16 ENCOUNTER — Other Ambulatory Visit: Payer: Self-pay

## 2018-09-16 DIAGNOSIS — M7989 Other specified soft tissue disorders: Secondary | ICD-10-CM

## 2018-09-16 DIAGNOSIS — E039 Hypothyroidism, unspecified: Secondary | ICD-10-CM | POA: Diagnosis not present

## 2018-09-16 DIAGNOSIS — M79605 Pain in left leg: Secondary | ICD-10-CM | POA: Diagnosis not present

## 2018-09-16 NOTE — Progress Notes (Signed)
Lower extremity venous has been completed.   Preliminary results in CV Proc.   Attempted to call results, no answer.  Abram Sander 09/16/2018 11:39 AM

## 2018-09-19 DIAGNOSIS — D649 Anemia, unspecified: Secondary | ICD-10-CM | POA: Diagnosis not present

## 2018-09-19 DIAGNOSIS — Z79899 Other long term (current) drug therapy: Secondary | ICD-10-CM | POA: Diagnosis not present

## 2018-09-19 DIAGNOSIS — L03116 Cellulitis of left lower limb: Secondary | ICD-10-CM | POA: Diagnosis not present

## 2018-09-19 DIAGNOSIS — D6869 Other thrombophilia: Secondary | ICD-10-CM | POA: Diagnosis not present

## 2018-09-19 DIAGNOSIS — I480 Paroxysmal atrial fibrillation: Secondary | ICD-10-CM | POA: Diagnosis not present

## 2018-09-19 DIAGNOSIS — I10 Essential (primary) hypertension: Secondary | ICD-10-CM | POA: Diagnosis not present

## 2018-09-25 DIAGNOSIS — R2689 Other abnormalities of gait and mobility: Secondary | ICD-10-CM | POA: Diagnosis not present

## 2018-09-25 DIAGNOSIS — M179 Osteoarthritis of knee, unspecified: Secondary | ICD-10-CM | POA: Diagnosis not present

## 2018-09-25 DIAGNOSIS — M6281 Muscle weakness (generalized): Secondary | ICD-10-CM | POA: Diagnosis not present

## 2018-09-25 DIAGNOSIS — I4891 Unspecified atrial fibrillation: Secondary | ICD-10-CM | POA: Diagnosis not present

## 2018-09-26 DIAGNOSIS — D6869 Other thrombophilia: Secondary | ICD-10-CM | POA: Diagnosis not present

## 2018-09-26 DIAGNOSIS — I4891 Unspecified atrial fibrillation: Secondary | ICD-10-CM | POA: Diagnosis not present

## 2018-09-26 DIAGNOSIS — M179 Osteoarthritis of knee, unspecified: Secondary | ICD-10-CM | POA: Diagnosis not present

## 2018-09-26 DIAGNOSIS — I480 Paroxysmal atrial fibrillation: Secondary | ICD-10-CM | POA: Diagnosis not present

## 2018-09-26 DIAGNOSIS — R2689 Other abnormalities of gait and mobility: Secondary | ICD-10-CM | POA: Diagnosis not present

## 2018-09-26 DIAGNOSIS — I10 Essential (primary) hypertension: Secondary | ICD-10-CM | POA: Diagnosis not present

## 2018-09-26 DIAGNOSIS — L03116 Cellulitis of left lower limb: Secondary | ICD-10-CM | POA: Diagnosis not present

## 2018-09-26 DIAGNOSIS — R269 Unspecified abnormalities of gait and mobility: Secondary | ICD-10-CM | POA: Diagnosis not present

## 2018-09-26 DIAGNOSIS — I872 Venous insufficiency (chronic) (peripheral): Secondary | ICD-10-CM | POA: Diagnosis not present

## 2018-09-26 DIAGNOSIS — M6281 Muscle weakness (generalized): Secondary | ICD-10-CM | POA: Diagnosis not present

## 2018-09-26 DIAGNOSIS — D508 Other iron deficiency anemias: Secondary | ICD-10-CM | POA: Diagnosis not present

## 2018-09-27 DIAGNOSIS — R2689 Other abnormalities of gait and mobility: Secondary | ICD-10-CM | POA: Diagnosis not present

## 2018-09-27 DIAGNOSIS — M6281 Muscle weakness (generalized): Secondary | ICD-10-CM | POA: Diagnosis not present

## 2018-09-27 DIAGNOSIS — M179 Osteoarthritis of knee, unspecified: Secondary | ICD-10-CM | POA: Diagnosis not present

## 2018-09-27 DIAGNOSIS — I4891 Unspecified atrial fibrillation: Secondary | ICD-10-CM | POA: Diagnosis not present

## 2018-09-30 DIAGNOSIS — R2689 Other abnormalities of gait and mobility: Secondary | ICD-10-CM | POA: Diagnosis not present

## 2018-09-30 DIAGNOSIS — I4891 Unspecified atrial fibrillation: Secondary | ICD-10-CM | POA: Diagnosis not present

## 2018-09-30 DIAGNOSIS — M6281 Muscle weakness (generalized): Secondary | ICD-10-CM | POA: Diagnosis not present

## 2018-09-30 DIAGNOSIS — M179 Osteoarthritis of knee, unspecified: Secondary | ICD-10-CM | POA: Diagnosis not present

## 2018-10-01 DIAGNOSIS — M179 Osteoarthritis of knee, unspecified: Secondary | ICD-10-CM | POA: Diagnosis not present

## 2018-10-01 DIAGNOSIS — R2689 Other abnormalities of gait and mobility: Secondary | ICD-10-CM | POA: Diagnosis not present

## 2018-10-01 DIAGNOSIS — I4891 Unspecified atrial fibrillation: Secondary | ICD-10-CM | POA: Diagnosis not present

## 2018-10-01 DIAGNOSIS — M6281 Muscle weakness (generalized): Secondary | ICD-10-CM | POA: Diagnosis not present

## 2018-10-02 DIAGNOSIS — M179 Osteoarthritis of knee, unspecified: Secondary | ICD-10-CM | POA: Diagnosis not present

## 2018-10-02 DIAGNOSIS — R2689 Other abnormalities of gait and mobility: Secondary | ICD-10-CM | POA: Diagnosis not present

## 2018-10-02 DIAGNOSIS — I4891 Unspecified atrial fibrillation: Secondary | ICD-10-CM | POA: Diagnosis not present

## 2018-10-02 DIAGNOSIS — M6281 Muscle weakness (generalized): Secondary | ICD-10-CM | POA: Diagnosis not present

## 2018-10-03 DIAGNOSIS — R2689 Other abnormalities of gait and mobility: Secondary | ICD-10-CM | POA: Diagnosis not present

## 2018-10-03 DIAGNOSIS — M6281 Muscle weakness (generalized): Secondary | ICD-10-CM | POA: Diagnosis not present

## 2018-10-03 DIAGNOSIS — I4891 Unspecified atrial fibrillation: Secondary | ICD-10-CM | POA: Diagnosis not present

## 2018-10-03 DIAGNOSIS — M179 Osteoarthritis of knee, unspecified: Secondary | ICD-10-CM | POA: Diagnosis not present

## 2018-10-04 DIAGNOSIS — M179 Osteoarthritis of knee, unspecified: Secondary | ICD-10-CM | POA: Diagnosis not present

## 2018-10-04 DIAGNOSIS — M6281 Muscle weakness (generalized): Secondary | ICD-10-CM | POA: Diagnosis not present

## 2018-10-04 DIAGNOSIS — R2689 Other abnormalities of gait and mobility: Secondary | ICD-10-CM | POA: Diagnosis not present

## 2018-10-04 DIAGNOSIS — I4891 Unspecified atrial fibrillation: Secondary | ICD-10-CM | POA: Diagnosis not present

## 2018-10-07 DIAGNOSIS — M179 Osteoarthritis of knee, unspecified: Secondary | ICD-10-CM | POA: Diagnosis not present

## 2018-10-07 DIAGNOSIS — R2689 Other abnormalities of gait and mobility: Secondary | ICD-10-CM | POA: Diagnosis not present

## 2018-10-07 DIAGNOSIS — I4891 Unspecified atrial fibrillation: Secondary | ICD-10-CM | POA: Diagnosis not present

## 2018-10-07 DIAGNOSIS — M6281 Muscle weakness (generalized): Secondary | ICD-10-CM | POA: Diagnosis not present

## 2018-10-08 DIAGNOSIS — M6281 Muscle weakness (generalized): Secondary | ICD-10-CM | POA: Diagnosis not present

## 2018-10-08 DIAGNOSIS — D508 Other iron deficiency anemias: Secondary | ICD-10-CM | POA: Diagnosis not present

## 2018-10-08 DIAGNOSIS — R2689 Other abnormalities of gait and mobility: Secondary | ICD-10-CM | POA: Diagnosis not present

## 2018-10-08 DIAGNOSIS — I4891 Unspecified atrial fibrillation: Secondary | ICD-10-CM | POA: Diagnosis not present

## 2018-10-08 DIAGNOSIS — M179 Osteoarthritis of knee, unspecified: Secondary | ICD-10-CM | POA: Diagnosis not present

## 2018-10-09 DIAGNOSIS — I4891 Unspecified atrial fibrillation: Secondary | ICD-10-CM | POA: Diagnosis not present

## 2018-10-09 DIAGNOSIS — M6281 Muscle weakness (generalized): Secondary | ICD-10-CM | POA: Diagnosis not present

## 2018-10-09 DIAGNOSIS — M179 Osteoarthritis of knee, unspecified: Secondary | ICD-10-CM | POA: Diagnosis not present

## 2018-10-09 DIAGNOSIS — R2689 Other abnormalities of gait and mobility: Secondary | ICD-10-CM | POA: Diagnosis not present

## 2018-10-10 DIAGNOSIS — M179 Osteoarthritis of knee, unspecified: Secondary | ICD-10-CM | POA: Diagnosis not present

## 2018-10-10 DIAGNOSIS — I4891 Unspecified atrial fibrillation: Secondary | ICD-10-CM | POA: Diagnosis not present

## 2018-10-10 DIAGNOSIS — M6281 Muscle weakness (generalized): Secondary | ICD-10-CM | POA: Diagnosis not present

## 2018-10-10 DIAGNOSIS — R2689 Other abnormalities of gait and mobility: Secondary | ICD-10-CM | POA: Diagnosis not present

## 2018-10-11 DIAGNOSIS — M6281 Muscle weakness (generalized): Secondary | ICD-10-CM | POA: Diagnosis not present

## 2018-10-11 DIAGNOSIS — R2689 Other abnormalities of gait and mobility: Secondary | ICD-10-CM | POA: Diagnosis not present

## 2018-10-11 DIAGNOSIS — I4891 Unspecified atrial fibrillation: Secondary | ICD-10-CM | POA: Diagnosis not present

## 2018-10-11 DIAGNOSIS — M179 Osteoarthritis of knee, unspecified: Secondary | ICD-10-CM | POA: Diagnosis not present

## 2018-10-14 DIAGNOSIS — M6281 Muscle weakness (generalized): Secondary | ICD-10-CM | POA: Diagnosis not present

## 2018-10-14 DIAGNOSIS — M179 Osteoarthritis of knee, unspecified: Secondary | ICD-10-CM | POA: Diagnosis not present

## 2018-10-14 DIAGNOSIS — R2689 Other abnormalities of gait and mobility: Secondary | ICD-10-CM | POA: Diagnosis not present

## 2018-10-14 DIAGNOSIS — I4891 Unspecified atrial fibrillation: Secondary | ICD-10-CM | POA: Diagnosis not present

## 2018-10-15 DIAGNOSIS — M179 Osteoarthritis of knee, unspecified: Secondary | ICD-10-CM | POA: Diagnosis not present

## 2018-10-15 DIAGNOSIS — E039 Hypothyroidism, unspecified: Secondary | ICD-10-CM | POA: Diagnosis not present

## 2018-10-15 DIAGNOSIS — R2689 Other abnormalities of gait and mobility: Secondary | ICD-10-CM | POA: Diagnosis not present

## 2018-10-15 DIAGNOSIS — I4891 Unspecified atrial fibrillation: Secondary | ICD-10-CM | POA: Diagnosis not present

## 2018-10-15 DIAGNOSIS — Z79899 Other long term (current) drug therapy: Secondary | ICD-10-CM | POA: Diagnosis not present

## 2018-10-15 DIAGNOSIS — M6281 Muscle weakness (generalized): Secondary | ICD-10-CM | POA: Diagnosis not present

## 2018-10-16 DIAGNOSIS — R2689 Other abnormalities of gait and mobility: Secondary | ICD-10-CM | POA: Diagnosis not present

## 2018-10-16 DIAGNOSIS — M179 Osteoarthritis of knee, unspecified: Secondary | ICD-10-CM | POA: Diagnosis not present

## 2018-10-16 DIAGNOSIS — M6281 Muscle weakness (generalized): Secondary | ICD-10-CM | POA: Diagnosis not present

## 2018-10-16 DIAGNOSIS — I4891 Unspecified atrial fibrillation: Secondary | ICD-10-CM | POA: Diagnosis not present

## 2018-10-17 DIAGNOSIS — E785 Hyperlipidemia, unspecified: Secondary | ICD-10-CM | POA: Diagnosis not present

## 2018-10-17 DIAGNOSIS — Z23 Encounter for immunization: Secondary | ICD-10-CM | POA: Diagnosis not present

## 2018-10-17 DIAGNOSIS — E569 Vitamin deficiency, unspecified: Secondary | ICD-10-CM | POA: Diagnosis not present

## 2018-10-17 DIAGNOSIS — Z7409 Other reduced mobility: Secondary | ICD-10-CM | POA: Diagnosis not present

## 2018-10-17 DIAGNOSIS — M818 Other osteoporosis without current pathological fracture: Secondary | ICD-10-CM | POA: Diagnosis not present

## 2018-10-17 DIAGNOSIS — M24561 Contracture, right knee: Secondary | ICD-10-CM | POA: Diagnosis not present

## 2018-10-17 DIAGNOSIS — R2243 Localized swelling, mass and lump, lower limb, bilateral: Secondary | ICD-10-CM | POA: Diagnosis not present

## 2018-10-17 DIAGNOSIS — D649 Anemia, unspecified: Secondary | ICD-10-CM | POA: Diagnosis not present

## 2018-10-17 DIAGNOSIS — I10 Essential (primary) hypertension: Secondary | ICD-10-CM | POA: Diagnosis not present

## 2018-10-17 DIAGNOSIS — M6281 Muscle weakness (generalized): Secondary | ICD-10-CM | POA: Diagnosis not present

## 2018-10-17 DIAGNOSIS — M25561 Pain in right knee: Secondary | ICD-10-CM | POA: Diagnosis not present

## 2018-10-17 DIAGNOSIS — I480 Paroxysmal atrial fibrillation: Secondary | ICD-10-CM | POA: Diagnosis not present

## 2018-10-17 DIAGNOSIS — E039 Hypothyroidism, unspecified: Secondary | ICD-10-CM | POA: Diagnosis not present

## 2018-10-17 DIAGNOSIS — R296 Repeated falls: Secondary | ICD-10-CM | POA: Diagnosis not present

## 2018-10-17 DIAGNOSIS — Z1159 Encounter for screening for other viral diseases: Secondary | ICD-10-CM | POA: Diagnosis not present

## 2018-10-17 DIAGNOSIS — E559 Vitamin D deficiency, unspecified: Secondary | ICD-10-CM | POA: Diagnosis not present

## 2018-10-17 DIAGNOSIS — R609 Edema, unspecified: Secondary | ICD-10-CM | POA: Diagnosis not present

## 2018-10-17 DIAGNOSIS — M810 Age-related osteoporosis without current pathological fracture: Secondary | ICD-10-CM | POA: Diagnosis not present

## 2018-10-17 DIAGNOSIS — M109 Gout, unspecified: Secondary | ICD-10-CM | POA: Diagnosis not present

## 2018-10-17 DIAGNOSIS — I714 Abdominal aortic aneurysm, without rupture: Secondary | ICD-10-CM | POA: Diagnosis not present

## 2018-10-17 DIAGNOSIS — R2242 Localized swelling, mass and lump, left lower limb: Secondary | ICD-10-CM | POA: Diagnosis not present

## 2018-10-17 DIAGNOSIS — N390 Urinary tract infection, site not specified: Secondary | ICD-10-CM | POA: Diagnosis not present

## 2018-10-17 DIAGNOSIS — H9193 Unspecified hearing loss, bilateral: Secondary | ICD-10-CM | POA: Diagnosis not present

## 2018-10-17 DIAGNOSIS — R52 Pain, unspecified: Secondary | ICD-10-CM | POA: Diagnosis not present

## 2018-10-17 DIAGNOSIS — M79605 Pain in left leg: Secondary | ICD-10-CM | POA: Diagnosis not present

## 2018-10-17 DIAGNOSIS — L89322 Pressure ulcer of left buttock, stage 2: Secondary | ICD-10-CM | POA: Diagnosis not present

## 2018-10-17 DIAGNOSIS — G8929 Other chronic pain: Secondary | ICD-10-CM | POA: Diagnosis not present

## 2018-10-17 DIAGNOSIS — G3184 Mild cognitive impairment, so stated: Secondary | ICD-10-CM | POA: Diagnosis not present

## 2018-10-17 DIAGNOSIS — R41841 Cognitive communication deficit: Secondary | ICD-10-CM | POA: Diagnosis not present

## 2018-10-17 DIAGNOSIS — R634 Abnormal weight loss: Secondary | ICD-10-CM | POA: Diagnosis not present

## 2018-10-17 DIAGNOSIS — L89312 Pressure ulcer of right buttock, stage 2: Secondary | ICD-10-CM | POA: Diagnosis not present

## 2018-10-17 DIAGNOSIS — M179 Osteoarthritis of knee, unspecified: Secondary | ICD-10-CM | POA: Diagnosis not present

## 2018-10-17 DIAGNOSIS — Z9181 History of falling: Secondary | ICD-10-CM | POA: Diagnosis not present

## 2018-10-17 DIAGNOSIS — R2689 Other abnormalities of gait and mobility: Secondary | ICD-10-CM | POA: Diagnosis not present

## 2018-10-17 DIAGNOSIS — I4891 Unspecified atrial fibrillation: Secondary | ICD-10-CM | POA: Diagnosis not present

## 2018-10-17 DIAGNOSIS — M1711 Unilateral primary osteoarthritis, right knee: Secondary | ICD-10-CM | POA: Diagnosis not present

## 2018-10-17 DIAGNOSIS — Z8551 Personal history of malignant neoplasm of bladder: Secondary | ICD-10-CM | POA: Diagnosis not present

## 2018-10-17 DIAGNOSIS — R8271 Bacteriuria: Secondary | ICD-10-CM | POA: Diagnosis not present

## 2018-10-18 DIAGNOSIS — I4891 Unspecified atrial fibrillation: Secondary | ICD-10-CM | POA: Diagnosis not present

## 2018-10-18 DIAGNOSIS — M818 Other osteoporosis without current pathological fracture: Secondary | ICD-10-CM | POA: Diagnosis not present

## 2018-10-18 DIAGNOSIS — M24561 Contracture, right knee: Secondary | ICD-10-CM | POA: Diagnosis not present

## 2018-10-18 DIAGNOSIS — I714 Abdominal aortic aneurysm, without rupture: Secondary | ICD-10-CM | POA: Diagnosis not present

## 2018-10-18 DIAGNOSIS — M109 Gout, unspecified: Secondary | ICD-10-CM | POA: Diagnosis not present

## 2018-10-18 DIAGNOSIS — E039 Hypothyroidism, unspecified: Secondary | ICD-10-CM | POA: Diagnosis not present

## 2018-10-18 DIAGNOSIS — M6281 Muscle weakness (generalized): Secondary | ICD-10-CM | POA: Diagnosis not present

## 2018-10-18 DIAGNOSIS — R296 Repeated falls: Secondary | ICD-10-CM | POA: Diagnosis not present

## 2018-10-18 DIAGNOSIS — D649 Anemia, unspecified: Secondary | ICD-10-CM | POA: Diagnosis not present

## 2018-10-18 DIAGNOSIS — H9193 Unspecified hearing loss, bilateral: Secondary | ICD-10-CM | POA: Diagnosis not present

## 2018-10-18 DIAGNOSIS — R634 Abnormal weight loss: Secondary | ICD-10-CM | POA: Diagnosis not present

## 2018-10-18 DIAGNOSIS — M1711 Unilateral primary osteoarthritis, right knee: Secondary | ICD-10-CM | POA: Diagnosis not present

## 2018-10-21 DIAGNOSIS — M818 Other osteoporosis without current pathological fracture: Secondary | ICD-10-CM | POA: Diagnosis not present

## 2018-10-21 DIAGNOSIS — D649 Anemia, unspecified: Secondary | ICD-10-CM | POA: Diagnosis not present

## 2018-10-21 DIAGNOSIS — I714 Abdominal aortic aneurysm, without rupture: Secondary | ICD-10-CM | POA: Diagnosis not present

## 2018-10-21 DIAGNOSIS — H9193 Unspecified hearing loss, bilateral: Secondary | ICD-10-CM | POA: Diagnosis not present

## 2018-10-21 DIAGNOSIS — R634 Abnormal weight loss: Secondary | ICD-10-CM | POA: Diagnosis not present

## 2018-10-21 DIAGNOSIS — R296 Repeated falls: Secondary | ICD-10-CM | POA: Diagnosis not present

## 2018-10-21 DIAGNOSIS — E039 Hypothyroidism, unspecified: Secondary | ICD-10-CM | POA: Diagnosis not present

## 2018-10-21 DIAGNOSIS — M6281 Muscle weakness (generalized): Secondary | ICD-10-CM | POA: Diagnosis not present

## 2018-10-21 DIAGNOSIS — M1711 Unilateral primary osteoarthritis, right knee: Secondary | ICD-10-CM | POA: Diagnosis not present

## 2018-10-21 DIAGNOSIS — M109 Gout, unspecified: Secondary | ICD-10-CM | POA: Diagnosis not present

## 2018-10-21 DIAGNOSIS — M24561 Contracture, right knee: Secondary | ICD-10-CM | POA: Diagnosis not present

## 2018-10-21 DIAGNOSIS — I4891 Unspecified atrial fibrillation: Secondary | ICD-10-CM | POA: Diagnosis not present

## 2018-10-23 ENCOUNTER — Other Ambulatory Visit: Payer: Self-pay

## 2018-10-23 ENCOUNTER — Telehealth: Payer: Medicare Other | Admitting: Neurology

## 2018-10-23 ENCOUNTER — Other Ambulatory Visit: Payer: Self-pay | Admitting: *Deleted

## 2018-10-23 DIAGNOSIS — I714 Abdominal aortic aneurysm, without rupture: Secondary | ICD-10-CM | POA: Diagnosis not present

## 2018-10-23 DIAGNOSIS — H9193 Unspecified hearing loss, bilateral: Secondary | ICD-10-CM | POA: Diagnosis not present

## 2018-10-23 DIAGNOSIS — E559 Vitamin D deficiency, unspecified: Secondary | ICD-10-CM | POA: Diagnosis not present

## 2018-10-23 DIAGNOSIS — I4891 Unspecified atrial fibrillation: Secondary | ICD-10-CM | POA: Diagnosis not present

## 2018-10-23 DIAGNOSIS — R609 Edema, unspecified: Secondary | ICD-10-CM | POA: Diagnosis not present

## 2018-10-23 DIAGNOSIS — D649 Anemia, unspecified: Secondary | ICD-10-CM | POA: Diagnosis not present

## 2018-10-23 DIAGNOSIS — E785 Hyperlipidemia, unspecified: Secondary | ICD-10-CM | POA: Diagnosis not present

## 2018-10-23 DIAGNOSIS — Z7409 Other reduced mobility: Secondary | ICD-10-CM | POA: Diagnosis not present

## 2018-10-23 DIAGNOSIS — G3184 Mild cognitive impairment, so stated: Secondary | ICD-10-CM | POA: Diagnosis not present

## 2018-10-23 DIAGNOSIS — N390 Urinary tract infection, site not specified: Secondary | ICD-10-CM | POA: Diagnosis not present

## 2018-10-23 DIAGNOSIS — M109 Gout, unspecified: Secondary | ICD-10-CM | POA: Diagnosis not present

## 2018-10-23 DIAGNOSIS — E039 Hypothyroidism, unspecified: Secondary | ICD-10-CM | POA: Diagnosis not present

## 2018-10-24 NOTE — Patient Outreach (Signed)
Member assessed for potential [facility] Care Management needs as a benefit of  [place] [employer].  Member is currently receiving rehab therapy at [facility].  Member discussed in weekly telephonic IDT meeting with facility staff, [facility] UM team, and writer.  Facility reports Mr. Grass resides in the [place] portion of [place]. States he has had multiple falls prior. States care plan meeting with son is planned for 10/24/18.   Facility reports Mr. Derstine has left lower extremity swelling and wears ted hose. Questionable heart failure. Facility states member will likely want to return to his independent living apartment. States they will likely arrange for caregiver assistance thru [facility] home care department.  Will continue to follow for disposition plans, progression, and for potential [facility] Care Management needs.    Marthenia Rolling, MSN-Ed, RN,BSN [place] Acute Care Coordinator [phone] [facility]) [phone]  (Toll free office)

## 2018-10-25 DIAGNOSIS — G3184 Mild cognitive impairment, so stated: Secondary | ICD-10-CM | POA: Diagnosis not present

## 2018-10-25 DIAGNOSIS — M24561 Contracture, right knee: Secondary | ICD-10-CM | POA: Diagnosis not present

## 2018-10-25 DIAGNOSIS — R296 Repeated falls: Secondary | ICD-10-CM | POA: Diagnosis not present

## 2018-10-25 DIAGNOSIS — E785 Hyperlipidemia, unspecified: Secondary | ICD-10-CM | POA: Diagnosis not present

## 2018-10-25 DIAGNOSIS — R634 Abnormal weight loss: Secondary | ICD-10-CM | POA: Diagnosis not present

## 2018-10-25 DIAGNOSIS — R609 Edema, unspecified: Secondary | ICD-10-CM | POA: Diagnosis not present

## 2018-10-25 DIAGNOSIS — M818 Other osteoporosis without current pathological fracture: Secondary | ICD-10-CM | POA: Diagnosis not present

## 2018-10-25 DIAGNOSIS — E039 Hypothyroidism, unspecified: Secondary | ICD-10-CM | POA: Diagnosis not present

## 2018-10-25 DIAGNOSIS — I714 Abdominal aortic aneurysm, without rupture: Secondary | ICD-10-CM | POA: Diagnosis not present

## 2018-10-25 DIAGNOSIS — N390 Urinary tract infection, site not specified: Secondary | ICD-10-CM | POA: Diagnosis not present

## 2018-10-25 DIAGNOSIS — D649 Anemia, unspecified: Secondary | ICD-10-CM | POA: Diagnosis not present

## 2018-10-25 DIAGNOSIS — Z7409 Other reduced mobility: Secondary | ICD-10-CM | POA: Diagnosis not present

## 2018-10-28 DIAGNOSIS — M24561 Contracture, right knee: Secondary | ICD-10-CM | POA: Diagnosis not present

## 2018-10-28 DIAGNOSIS — R634 Abnormal weight loss: Secondary | ICD-10-CM | POA: Diagnosis not present

## 2018-10-28 DIAGNOSIS — M109 Gout, unspecified: Secondary | ICD-10-CM | POA: Diagnosis not present

## 2018-10-28 DIAGNOSIS — E785 Hyperlipidemia, unspecified: Secondary | ICD-10-CM | POA: Diagnosis not present

## 2018-10-28 DIAGNOSIS — R296 Repeated falls: Secondary | ICD-10-CM | POA: Diagnosis not present

## 2018-10-28 DIAGNOSIS — Z7409 Other reduced mobility: Secondary | ICD-10-CM | POA: Diagnosis not present

## 2018-10-28 DIAGNOSIS — I714 Abdominal aortic aneurysm, without rupture: Secondary | ICD-10-CM | POA: Diagnosis not present

## 2018-10-28 DIAGNOSIS — M818 Other osteoporosis without current pathological fracture: Secondary | ICD-10-CM | POA: Diagnosis not present

## 2018-10-28 DIAGNOSIS — M1711 Unilateral primary osteoarthritis, right knee: Secondary | ICD-10-CM | POA: Diagnosis not present

## 2018-10-28 DIAGNOSIS — R2243 Localized swelling, mass and lump, lower limb, bilateral: Secondary | ICD-10-CM | POA: Diagnosis not present

## 2018-10-28 DIAGNOSIS — D649 Anemia, unspecified: Secondary | ICD-10-CM | POA: Diagnosis not present

## 2018-10-28 DIAGNOSIS — E039 Hypothyroidism, unspecified: Secondary | ICD-10-CM | POA: Diagnosis not present

## 2018-11-01 ENCOUNTER — Telehealth: Payer: Medicare Other | Admitting: Cardiology

## 2018-11-05 DIAGNOSIS — M6281 Muscle weakness (generalized): Secondary | ICD-10-CM | POA: Diagnosis not present

## 2018-11-05 DIAGNOSIS — E039 Hypothyroidism, unspecified: Secondary | ICD-10-CM | POA: Diagnosis not present

## 2018-11-05 DIAGNOSIS — M25561 Pain in right knee: Secondary | ICD-10-CM | POA: Diagnosis not present

## 2018-11-05 DIAGNOSIS — I480 Paroxysmal atrial fibrillation: Secondary | ICD-10-CM | POA: Diagnosis not present

## 2018-11-05 DIAGNOSIS — I10 Essential (primary) hypertension: Secondary | ICD-10-CM | POA: Diagnosis not present

## 2018-11-06 ENCOUNTER — Other Ambulatory Visit: Payer: Self-pay | Admitting: *Deleted

## 2018-11-06 NOTE — Patient Outreach (Signed)
Member assessed for potential [facility] Care Management needs as a benefit of  [place] [employer].  Member is currently receiving rehab therapy at [facility].  Collaboration with [place] after her telephonic IDT meeting with [facility] facility staff.  Per [facility] UM RN, facility staff to have care plan meeting with family to discuss long term care. Member is from [place] and is not progressing with therapy and requires increased assistance. Continues with knee pain. Anticipate transition to lower level of care next week.   Will continue to follow for disposition plans, progression, and for potential [facility] Care Management needs.    Marthenia Rolling, MSN-Ed, RN,BSN [place] Acute Care Coordinator [phone] [facility]) [phone]  (Toll free office)

## 2018-11-08 DIAGNOSIS — R8271 Bacteriuria: Secondary | ICD-10-CM | POA: Diagnosis not present

## 2018-11-08 DIAGNOSIS — Z8551 Personal history of malignant neoplasm of bladder: Secondary | ICD-10-CM | POA: Diagnosis not present

## 2018-11-12 DIAGNOSIS — R609 Edema, unspecified: Secondary | ICD-10-CM | POA: Diagnosis not present

## 2018-11-12 DIAGNOSIS — M6281 Muscle weakness (generalized): Secondary | ICD-10-CM | POA: Diagnosis not present

## 2018-11-12 DIAGNOSIS — I4891 Unspecified atrial fibrillation: Secondary | ICD-10-CM | POA: Diagnosis not present

## 2018-11-12 DIAGNOSIS — G8929 Other chronic pain: Secondary | ICD-10-CM | POA: Diagnosis not present

## 2018-11-12 DIAGNOSIS — M810 Age-related osteoporosis without current pathological fracture: Secondary | ICD-10-CM | POA: Diagnosis not present

## 2018-11-13 DIAGNOSIS — M1711 Unilateral primary osteoarthritis, right knee: Secondary | ICD-10-CM | POA: Diagnosis not present

## 2018-11-13 DIAGNOSIS — R296 Repeated falls: Secondary | ICD-10-CM | POA: Diagnosis not present

## 2018-11-13 DIAGNOSIS — Z03818 Encounter for observation for suspected exposure to other biological agents ruled out: Secondary | ICD-10-CM | POA: Diagnosis not present

## 2018-11-14 ENCOUNTER — Other Ambulatory Visit: Payer: Self-pay | Admitting: *Deleted

## 2018-11-14 DIAGNOSIS — N390 Urinary tract infection, site not specified: Secondary | ICD-10-CM | POA: Diagnosis not present

## 2018-11-14 NOTE — Patient Outreach (Signed)
Late entry for 11/13/18.  Member assessed for potential [facility] Care Management needs as a benefit of  [place] [employer].  Member has been receiving rehab therapy at [facility].  Member discussed in weekly telephonic IDT meeting with facility staff, [facility] UM team, and writer.  Facility reports member will transition to private pay at [facility] on 11/13/18 for 30 days. After 30 days, it will be decided whether Mr. Dircks will remain under long term care or return to [place].   Will sign off. No identifiable [facility] Care Management needs at this time.   Marthenia Rolling, MSN-Ed, RN,BSN [place] Acute Care Coordinator [phone] [facility]) [phone]  (Toll free office)

## 2018-11-19 ENCOUNTER — Encounter: Payer: Self-pay | Admitting: *Deleted

## 2018-11-19 ENCOUNTER — Ambulatory Visit (INDEPENDENT_AMBULATORY_CARE_PROVIDER_SITE_OTHER): Payer: Medicare Other | Admitting: Cardiology

## 2018-11-19 ENCOUNTER — Other Ambulatory Visit: Payer: Self-pay

## 2018-11-19 VITALS — BP 102/60 | HR 76 | Temp 97.5°F | Ht 71.0 in | Wt 162.0 lb

## 2018-11-19 DIAGNOSIS — I714 Abdominal aortic aneurysm, without rupture, unspecified: Secondary | ICD-10-CM | POA: Insufficient documentation

## 2018-11-19 DIAGNOSIS — I480 Paroxysmal atrial fibrillation: Secondary | ICD-10-CM | POA: Diagnosis not present

## 2018-11-19 DIAGNOSIS — I639 Cerebral infarction, unspecified: Secondary | ICD-10-CM

## 2018-11-19 DIAGNOSIS — I456 Pre-excitation syndrome: Secondary | ICD-10-CM | POA: Diagnosis not present

## 2018-11-19 DIAGNOSIS — E782 Mixed hyperlipidemia: Secondary | ICD-10-CM

## 2018-11-19 DIAGNOSIS — Z8719 Personal history of other diseases of the digestive system: Secondary | ICD-10-CM | POA: Insufficient documentation

## 2018-11-19 DIAGNOSIS — E785 Hyperlipidemia, unspecified: Secondary | ICD-10-CM | POA: Insufficient documentation

## 2018-11-19 DIAGNOSIS — I10 Essential (primary) hypertension: Secondary | ICD-10-CM

## 2018-11-19 DIAGNOSIS — C679 Malignant neoplasm of bladder, unspecified: Secondary | ICD-10-CM | POA: Insufficient documentation

## 2018-11-19 NOTE — Progress Notes (Signed)
Cardiology Office Note:    Date:  11/19/2018   ID:  Samuel Shaffer, DOB [birth year], MRN [id number]  PCP:  [name], MD  Cardiologist:  Jenean Lindau, MD   Referring MD: [name], MD    ASSESSMENT:    1. Wolff-Parkinson-White (WPW) pattern   2. Essential hypertension   3. Mixed dyslipidemia   4. Paroxysmal atrial fibrillation (HCC)   5. Cerebrovascular accident (CVA), unspecified mechanism ([place])   6. Abdominal aortic aneurysm without rupture ([place])    PLAN:    In order of problems listed above:  1. Paroxysmal atrial fibrillation:I discussed with the patient atrial fibrillation, disease process. Management and therapy including rate and rhythm control, anticoagulation benefits and potential risks were discussed extensively with the patient. Patient had multiple questions which were answered to patient's satisfaction. 2. Essential hypertension: Blood pressure stable.  Echocardiogram will be done to assess murmur heard on auscultation. 3. Mixed dyslipidemia: Lipids followed by primary care physician and patient follows dietary guidelines. 4. History of abdominal aortic aneurysm and we will do ultrasound to assess this. 5. Patient will be seen in follow-up appointment in 6 months or earlier if the patient has any concerns    Medication Adjustments/Labs and Tests Ordered: Current medicines are reviewed at length with the patient today.  Concerns regarding medicines are outlined above.  No orders of the defined types were placed in this encounter.  No orders of the defined types were placed in this encounter.    History of Present Illness:    Samuel Shaffer is a 85 y.o. male who is being seen today for the evaluation of pedal edema and atrial fibrillation at the request of [name], MD.  Patient is a pleasant 85 year old male.  He has past medical history of essential hypertension, stroke, atrial fibrillation most likely paroxysmal in nature and WPW  pattern.  He denies any palpitations chest pain or any such symptoms.  He is referred here for some pedal edema issues.  He is on anticoagulation for atrial fibrillation.  At the time of my evaluation, the patient is alert awake oriented and in no distress.  Past Medical History:  Diagnosis Date  . A-fib ([place])   . Abdominal aortic aneurysm (AAA) ([place])   . Anemia   . Arthritis   . Bladder cancer [facility])    s/p resection  . Closed left acetabular fracture ([place])   . Gout   . History of GI diverticular bleed   . Hyperlipidemia   . Hypertension   . Hypothyroidism   . Stroke ([place])   . Wolff-Parkinson-White (WPW) pattern     Past Surgical History:  Procedure Laterality Date  . CATARACT EXTRACTION    . CHOLECYSTECTOMY    . CYSTOURETHROSCOPY  06/18/2014   W/Fulguration &/Or Resection Bladder Tumor  . KNEE SURGERY    . TONSILLECTOMY      Current Medications: Current Meds  Medication Sig  . ELIQUIS 2.5 MG TABS tablet Take 2.5 mg by mouth 2 (two) times daily.   . ferrous sulfate 325 (65 FE) MG EC tablet Take 325 mg by mouth once.  . metoprolol tartrate (LOPRESSOR) 25 MG tablet   . Multiple Vitamins-Minerals (PRESERVISION AREDS 2 PO) Take by mouth.  . phenazopyridine (PYRIDIUM) 200 MG tablet Take by mouth.     Allergies:   Patient has no known allergies.   Social History   Socioeconomic History  . Marital status: Single    Spouse name: Not on file  .  Number of children: Not on file  . Years of education: Not on file  . Highest education level: Not on file  Occupational History  . Not on file  Social Needs  . Financial resource strain: Not on file  . Food insecurity    Worry: Not on file    Inability: Not on file  . Transportation needs    Medical: Not on file    Non-medical: Not on file  Tobacco Use  . Smoking status: Former Smoker    Quit date: 02/28/1968    Years since quitting: 50.7  . Smokeless tobacco: Never Used  Substance and Sexual Activity  . Alcohol use: Yes     Alcohol/week: 2.0 standard drinks    Types: 2 Glasses of wine per week  . Drug use: Not on file  . Sexual activity: Not on file  Lifestyle  . Physical activity    Days per week: Not on file    Minutes per session: Not on file  . Stress: Not on file  Relationships  . Social Herbalist on phone: Not on file    Gets together: Not on file    Attends religious service: Not on file    Active member of club or organization: Not on file    Attends meetings of clubs or organizations: Not on file    Relationship status: Not on file  Other Topics Concern  . Not on file  Social History Narrative  . Not on file     Family History: The patient's family history includes Cancer in his daughter; Heart disease in his father; Neurologic Disorder in his mother.  ROS:   Please see the history of present illness.    All other systems reviewed and are negative.  EKGs/Labs/Other Studies Reviewed:    The following studies were reviewed today: EKG reveals sinus rhythm and nonspecific ST-T changes.   Recent Labs: No results found for requested labs within last 8760 hours.  Recent Lipid Panel No results found for: CHOL, TRIG, HDL, CHOLHDL, VLDL, LDLCALC, LDLDIRECT  Physical Exam:    VS:  BP 102/60 (BP Location: Left Arm, Patient Position: Sitting, Cuff Size: Normal)   Pulse 76   Temp (!) 97.5 F (36.4 C)   Ht 5\' 11"  (1.803 m)   Wt 162 lb (73.5 kg)   SpO2 91%   BMI 22.59 kg/m     Wt Readings from Last 3 Encounters:  11/19/18 162 lb (73.5 kg)  06/20/15 173 lb (78.5 kg)     GEN: Patient is in no acute distress HEENT: Normal NECK: No JVD; No carotid bruits LYMPHATICS: No lymphadenopathy CARDIAC: S1 S2 regular, 2/6 systolic murmur at the apex. RESPIRATORY:  Clear to auscultation without rales, wheezing or rhonchi  ABDOMEN: Soft, non-tender, non-distended MUSCULOSKELETAL: Mild pedal edema; No deformity  SKIN: Warm and dry NEUROLOGIC:  Alert and oriented x 3  PSYCHIATRIC:  Normal affect    Signed, Jenean Lindau, MD  11/19/2018 2:07 PM    [place]

## 2018-11-19 NOTE — Patient Instructions (Signed)
Medication Instructions:  Your physician recommends that you continue on your current medications as directed. Please refer to the Current Medication list given to you today.  If you need a refill on your cardiac medications before your next appointment, please call your pharmacy.   Lab work: NONE If you have labs (blood work) drawn today and your tests are completely normal, you will receive your results only by: Marland Kitchen MyChart Message (if you have MyChart) OR . A paper copy in the mail If you have any lab test that is abnormal or we need to change your treatment, we will call you to review the results.  Testing/Procedures: You had an EKG performed today.  Your physician has requested that you have an echocardiogram. Echocardiography is a painless test that uses sound waves to create images of your heart. It provides your doctor with information about the size and shape of your heart and how well your heart's chambers and valves are working. This procedure takes approximately one hour. There are no restrictions for this procedure.  Your physician has requested that you have an abdominal aorta duplex. During this test, an ultrasound is used to evaluate the aorta. Allow 30 minutes for this exam. Do not eat after midnight the day before and avoid carbonated beverages  Follow-Up: At [facility], you and your health needs are our priority.  As part of our continuing mission to provide you with exceptional heart care, we have created designated Provider Care Teams.  These Care Teams include your primary Cardiologist (physician) and Advanced Practice Providers (APPs -  Physician Assistants and Nurse Practitioners) who all work together to provide you with the care you need, when you need it. You will need a follow up appointment in 6 months.    Any Other Special Instructions Will Be Listed Below  Echocardiogram An echocardiogram is a procedure that uses painless sound waves (ultrasound) to produce  an image of the heart. Images from an echocardiogram can provide important information about:  Signs of coronary artery disease (CAD).  Aneurysm detection. An aneurysm is a weak or damaged part of an artery wall that bulges out from the normal force of blood pumping through the body.  Heart size and shape. Changes in the size or shape of the heart can be associated with certain conditions, including heart failure, aneurysm, and CAD.  Heart muscle function.  Heart valve function.  Signs of a past heart attack.  Fluid buildup around the heart.  Thickening of the heart muscle.  A tumor or infectious growth around the heart valves. Tell a health care provider about:  Any allergies you have.  All medicines you are taking, including vitamins, herbs, eye drops, creams, and over-the-counter medicines.  Any blood disorders you have.  Any surgeries you have had.  Any medical conditions you have.  Whether you are pregnant or may be pregnant. What are the risks? Generally, this is a safe procedure. However, problems may occur, including:  Allergic reaction to dye (contrast) that may be used during the procedure. What happens before the procedure? No specific preparation is needed. You may eat and drink normally. What happens during the procedure?   An IV tube may be inserted into one of your veins.  You may receive contrast through this tube. A contrast is an injection that improves the quality of the pictures from your heart.  A gel will be applied to your chest.  A wand-like tool (transducer) will be moved over your chest. The gel  will help to transmit the sound waves from the transducer.  The sound waves will harmlessly bounce off of your heart to allow the heart images to be captured in real-time motion. The images will be recorded on a computer. The procedure may vary among health care providers and hospitals. What happens after the procedure?  You may return to your  normal, everyday life, including diet, activities, and medicines, unless your health care provider tells you not to do that. Summary  An echocardiogram is a procedure that uses painless sound waves (ultrasound) to produce an image of the heart.  Images from an echocardiogram can provide important information about the size and shape of your heart, heart muscle function, heart valve function, and fluid buildup around your heart.  You do not need to do anything to prepare before this procedure. You may eat and drink normally.  After the echocardiogram is completed, you may return to your normal, everyday life, unless your health care provider tells you not to do that. This information is not intended to replace advice given to you by your health care provider. Make sure you discuss any questions you have with your health care provider. Document Released: 02/11/2000 Document Revised: 06/06/2018 Document Reviewed: 03/18/2016 Elsevier Patient Education  [place].  Abdominal Aortic Aneurysm  An aneurysm is a bulge in one of the blood vessels that carry blood away from the heart (artery). It happens when blood pushes up against a weak or damaged place in the wall of an artery. An abdominal aortic aneurysm happens in the main artery of the body (aorta). Some aneurysms may not cause problems. If it grows, it can burst or tear, causing bleeding inside the body. This is an emergency. It needs to be treated right away. What are the causes? The exact cause of this condition is not known. What increases the risk? The following may make you more likely to get this condition:  Being a male who is 38 years of age or older.  Being white (Caucasian).  Using tobacco.  Having a family history of aneurysms.  Having the following conditions: ? Hardening of the arteries (arteriosclerosis). ? Inflammation of the walls of an artery (arteritis). ? Certain genetic conditions. ? Being very overweight  (obesity). ? An infection in the wall of the aorta (infectious aortitis). ? High cholesterol. ? High blood pressure (hypertension). What are the signs or symptoms? Symptoms depend on the size of the aneurysm and how fast it is growing. Most grow slowly and do not cause any symptoms. If symptoms do occur, they may include:  Pain in the belly (abdomen), side, or back.  Feeling full after eating only small amounts of food.  Feeling a throbbing lump in the belly. Symptoms that the aneurysm has burst (ruptured) include:  Sudden, very bad pain in the belly, side, or back.  Feeling sick to your stomach (nauseous).  Throwing up (vomiting).  Feeling light-headed or passing out. How is this treated? Treatment for this condition depends on:  The size of the aneurysm.  How fast it is growing.  Your age.  Your risk of having it burst. If your aneurysm is smaller than 2 inches (5 cm), your doctor may manage it by:  Checking it often to see if it is getting bigger. You may have an imaging test (ultrasound) to check it every 3-6 months, every year, or every few years.  Giving you medicines to: ? Control blood pressure. ? Treat pain. ? Fight infection. If  your aneurysm is larger than 2 inches (5 cm), you may need surgery to fix it. Follow these instructions at home: Lifestyle  Do not use any products that have nicotine or tobacco in them. This includes cigarettes, e-cigarettes, and chewing tobacco. If you need help quitting, ask your doctor.  Get regular exercise. Ask your doctor what types of exercise are best for you. Eating and drinking  Eat a heart-healthy diet. This includes eating plenty of: ? Fresh fruits and vegetables. ? Whole grains. ? Low-fat (lean) protein. ? Low-fat dairy products.  Avoid foods that are high in saturated fat and cholesterol. These foods include red meat and some dairy products.  Do not drink alcohol if: ? Your doctor tells you not to drink. ? You  are pregnant, may be pregnant, or are planning to become pregnant.  If you drink alcohol: ? Limit how much you use to:  0-1 drink a day for women.  0-2 drinks a day for men. ? Be aware of how much alcohol is in your drink. In the [place] one drink equals any of these:  One typical bottle of beer (12 oz).  One-half glass of wine (5 oz).  One shot of hard liquor (1 oz). General instructions  Take over-the-counter and prescription medicines only as told by your doctor.  Keep your blood pressure within normal limits. Ask your doctor what your blood pressure should be.  Have your blood sugar (glucose) level and cholesterol levels checked regularly. Keep your blood sugar level and cholesterol levels within normal limits.  Avoid heavy lifting and activities that take a lot of effort. Ask your doctor what activities are safe for you.  Keep all follow-up visits as told by your doctor. This is important. ? Talk to your doctor about regular screenings to see if the aneurysm is getting bigger. Contact a doctor if you:  Have pain in your belly, side, or back.  Have a throbbing feeling in your belly.  Have a family history of aneurysms. Get help right away if you:  Have sudden, bad pain in your belly, side, or back.  Feel sick to your stomach.  Throw up.  Have trouble pooping (constipation).  Have trouble peeing (urinating).  Feel light-headed.  Have a fast heart rate when you stand.  Have sweaty skin that is cold to the touch (clammy).  Have shortness of breath.  Have a fever. These symptoms may be an emergency. Do not wait to see if the symptoms will go away. Get medical help right away. Call your local emergency services (911 in the [place] Do not drive yourself to the hospital. Summary  An aneurysm is a bulge in one of the blood vessels that carry blood away from the heart (artery). Some aneurysms may not cause problems.  You may need to have yours checked often. If it  grows, it can burst or tear. This causes bleeding inside the body. It needs to be treated right away.  Follow instructions from your doctor about healthy lifestyle changes.  Keep all follow-up visits as told by your doctor. This is important. This information is not intended to replace advice given to you by your health care provider. Make sure you discuss any questions you have with your health care provider. Document Released: 06/10/2012 Document Revised: 06/03/2018 Document Reviewed: 09/22/2017 Elsevier Patient Education  2020 [employer].

## 2018-11-27 ENCOUNTER — Ambulatory Visit (HOSPITAL_BASED_OUTPATIENT_CLINIC_OR_DEPARTMENT_OTHER): Admission: RE | Admit: 2018-11-27 | Payer: Medicare Other | Source: Ambulatory Visit

## 2018-11-27 DIAGNOSIS — I4891 Unspecified atrial fibrillation: Secondary | ICD-10-CM | POA: Diagnosis not present

## 2018-11-27 DIAGNOSIS — M810 Age-related osteoporosis without current pathological fracture: Secondary | ICD-10-CM | POA: Diagnosis not present

## 2018-11-27 DIAGNOSIS — R609 Edema, unspecified: Secondary | ICD-10-CM | POA: Diagnosis not present

## 2018-11-27 DIAGNOSIS — M6281 Muscle weakness (generalized): Secondary | ICD-10-CM | POA: Diagnosis not present

## 2018-11-27 DIAGNOSIS — R296 Repeated falls: Secondary | ICD-10-CM | POA: Diagnosis not present

## 2018-11-27 DIAGNOSIS — H9190 Unspecified hearing loss, unspecified ear: Secondary | ICD-10-CM | POA: Diagnosis not present

## 2018-11-27 DIAGNOSIS — D509 Iron deficiency anemia, unspecified: Secondary | ICD-10-CM | POA: Diagnosis not present

## 2018-11-27 DIAGNOSIS — D649 Anemia, unspecified: Secondary | ICD-10-CM | POA: Diagnosis not present

## 2018-11-27 DIAGNOSIS — M24561 Contracture, right knee: Secondary | ICD-10-CM | POA: Diagnosis not present

## 2018-11-27 DIAGNOSIS — R634 Abnormal weight loss: Secondary | ICD-10-CM | POA: Diagnosis not present

## 2018-11-27 DIAGNOSIS — N390 Urinary tract infection, site not specified: Secondary | ICD-10-CM | POA: Diagnosis not present

## 2018-11-27 DIAGNOSIS — M818 Other osteoporosis without current pathological fracture: Secondary | ICD-10-CM | POA: Diagnosis not present

## 2018-11-27 DIAGNOSIS — I480 Paroxysmal atrial fibrillation: Secondary | ICD-10-CM | POA: Diagnosis not present

## 2018-11-27 DIAGNOSIS — G8929 Other chronic pain: Secondary | ICD-10-CM | POA: Diagnosis not present

## 2018-11-27 DIAGNOSIS — R2243 Localized swelling, mass and lump, lower limb, bilateral: Secondary | ICD-10-CM | POA: Diagnosis not present

## 2018-12-03 DIAGNOSIS — M810 Age-related osteoporosis without current pathological fracture: Secondary | ICD-10-CM | POA: Diagnosis not present

## 2018-12-03 DIAGNOSIS — R296 Repeated falls: Secondary | ICD-10-CM | POA: Diagnosis not present

## 2018-12-03 DIAGNOSIS — M6281 Muscle weakness (generalized): Secondary | ICD-10-CM | POA: Diagnosis not present

## 2018-12-03 DIAGNOSIS — R609 Edema, unspecified: Secondary | ICD-10-CM | POA: Diagnosis not present

## 2018-12-03 DIAGNOSIS — G8929 Other chronic pain: Secondary | ICD-10-CM | POA: Diagnosis not present

## 2018-12-04 ENCOUNTER — Other Ambulatory Visit: Payer: Self-pay

## 2018-12-04 ENCOUNTER — Ambulatory Visit (HOSPITAL_BASED_OUTPATIENT_CLINIC_OR_DEPARTMENT_OTHER)
Admission: RE | Admit: 2018-12-04 | Discharge: 2018-12-04 | Disposition: A | Discharge status: Home / Self Care | Payer: Medicare Other | Source: Ambulatory Visit | Attending: Cardiology | Admitting: Cardiology

## 2018-12-04 DIAGNOSIS — I714 Abdominal aortic aneurysm, without rupture: Secondary | ICD-10-CM

## 2018-12-04 DIAGNOSIS — I10 Essential (primary) hypertension: Secondary | ICD-10-CM | POA: Diagnosis not present

## 2018-12-04 NOTE — Progress Notes (Signed)
  Echocardiogram 2D Echocardiogram has been performed.  Cardell Peach 12/04/2018, 10:50 AM

## 2018-12-04 NOTE — Progress Notes (Signed)
AAA doppler performed  12/04/18 Samuel Shaffer

## 2018-12-10 ENCOUNTER — Telehealth: Payer: Self-pay

## 2018-12-10 DIAGNOSIS — Z1159 Encounter for screening for other viral diseases: Secondary | ICD-10-CM | POA: Diagnosis not present

## 2018-12-10 NOTE — Telephone Encounter (Signed)
Left message to call back for results, copy sent to Dr. [name] per [facility].

## 2018-12-10 NOTE — Telephone Encounter (Signed)
-----   Message from Jenean Lindau, MD sent at 12/05/2018 12:08 PM EDT ----- The results of the study is unremarkable. Please inform patient. I will discuss in detail at next appointment. [facility]c  primary care/referring physician Jenean Lindau, MD 12/05/2018 12:07 PM

## 2018-12-13 DIAGNOSIS — M818 Other osteoporosis without current pathological fracture: Secondary | ICD-10-CM | POA: Diagnosis not present

## 2018-12-13 DIAGNOSIS — R296 Repeated falls: Secondary | ICD-10-CM | POA: Diagnosis not present

## 2018-12-13 DIAGNOSIS — M810 Age-related osteoporosis without current pathological fracture: Secondary | ICD-10-CM | POA: Diagnosis not present

## 2018-12-13 DIAGNOSIS — M24561 Contracture, right knee: Secondary | ICD-10-CM | POA: Diagnosis not present

## 2018-12-13 DIAGNOSIS — I4891 Unspecified atrial fibrillation: Secondary | ICD-10-CM | POA: Diagnosis not present

## 2018-12-13 DIAGNOSIS — N390 Urinary tract infection, site not specified: Secondary | ICD-10-CM | POA: Diagnosis not present

## 2018-12-13 DIAGNOSIS — D649 Anemia, unspecified: Secondary | ICD-10-CM | POA: Diagnosis not present

## 2018-12-13 DIAGNOSIS — G8929 Other chronic pain: Secondary | ICD-10-CM | POA: Diagnosis not present

## 2018-12-13 DIAGNOSIS — R609 Edema, unspecified: Secondary | ICD-10-CM | POA: Diagnosis not present

## 2018-12-13 DIAGNOSIS — R2243 Localized swelling, mass and lump, lower limb, bilateral: Secondary | ICD-10-CM | POA: Diagnosis not present

## 2018-12-13 DIAGNOSIS — M6281 Muscle weakness (generalized): Secondary | ICD-10-CM | POA: Diagnosis not present

## 2018-12-13 DIAGNOSIS — R634 Abnormal weight loss: Secondary | ICD-10-CM | POA: Diagnosis not present

## 2018-12-13 DIAGNOSIS — Z9181 History of falling: Secondary | ICD-10-CM | POA: Diagnosis not present

## 2018-12-20 DIAGNOSIS — D509 Iron deficiency anemia, unspecified: Secondary | ICD-10-CM | POA: Diagnosis not present

## 2018-12-26 DIAGNOSIS — I10 Essential (primary) hypertension: Secondary | ICD-10-CM | POA: Diagnosis not present

## 2018-12-26 DIAGNOSIS — R29898 Other symptoms and signs involving the musculoskeletal system: Secondary | ICD-10-CM | POA: Diagnosis not present

## 2018-12-26 DIAGNOSIS — H35033 Hypertensive retinopathy, bilateral: Secondary | ICD-10-CM | POA: Diagnosis not present

## 2018-12-26 DIAGNOSIS — I480 Paroxysmal atrial fibrillation: Secondary | ICD-10-CM | POA: Diagnosis not present

## 2018-12-27 ENCOUNTER — Encounter: Payer: Self-pay | Admitting: Neurology

## 2018-12-27 ENCOUNTER — Other Ambulatory Visit: Payer: Self-pay

## 2018-12-27 ENCOUNTER — Ambulatory Visit (INDEPENDENT_AMBULATORY_CARE_PROVIDER_SITE_OTHER): Payer: Medicare Other | Admitting: Neurology

## 2018-12-27 VITALS — BP 138/88 | HR 74 | Ht 70.0 in

## 2018-12-27 DIAGNOSIS — I639 Cerebral infarction, unspecified: Secondary | ICD-10-CM | POA: Diagnosis not present

## 2018-12-27 DIAGNOSIS — R29898 Other symptoms and signs involving the musculoskeletal system: Secondary | ICD-10-CM | POA: Diagnosis not present

## 2018-12-27 DIAGNOSIS — M48061 Spinal stenosis, lumbar region without neurogenic claudication: Secondary | ICD-10-CM

## 2018-12-27 DIAGNOSIS — G959 Disease of spinal cord, unspecified: Secondary | ICD-10-CM

## 2018-12-27 NOTE — Patient Instructions (Signed)
We have sent a referral to [facility] for your MRI and they will call you directly to schedule your appointment. They are located at [address] If you need to contact them directly please call [phone].   

## 2018-12-27 NOTE — Progress Notes (Signed)
[facility] Clinic Note - Initial Visit   Date: 12/27/18  Samuel Shaffer MRN: [id number] DOB: [birth year]   Dear Dr. [name]:  Thank you for your kind referral of Samuel Shaffer for consultation of left leg weakness. Although his history is well known to you, please allow [place] to reiterate it for the purpose of our medical record. The patient was accompanied to the clinic by self.    History of Present Illness: Samuel Shaffer is a 85 y.o. right-handed male with paroxysmal atrial fibrillation, hypothyroidism, edema, hypertension,AAA,  presenting for evaluation of left leg weakness.  He arrives today from his assisted living facility in a wheelchair.  For the past 6 months, patient has been nonambulatory.  Prior to this, he reports having gradual and progressive weakness of the left leg causing him to fall frequently and the leg to buckle.  Most of his weakness is localized with left hip flexion.  He denies any associated back pain, joint pain, numbness, or tingling of the legs.  He denies any leg cramps.  He has tried physical therapy with no benefit.    Past Medical History:  Diagnosis Date   A-fib [facility])    Abdominal aortic aneurysm (AAA) ([place])    Anemia    Arthritis    Bladder cancer ([facility])    s/p resection   Closed left acetabular fracture (H[facility])    Gout    History of GI diverticular bleed    Hyperlipidemia    Hypertension    Hypothyroidism    Stroke ([place])    Wolff-Parkinson-White (WPW) pattern     Past Surgical History:  Procedure Laterality Date   CATARACT EXTRACTION     CHOLECYSTECTOMY     CYSTOURETHROSCOPY  06/18/2014   W/Fulguration &/Or Resection Bladder Tumor   KNEE SURGERY     TONSILLECTOMY       Medications:  Outpatient Encounter Medications as of 12/27/2018  Medication Sig Note   allopurinol (ZYLOPRIM) 300 MG tablet Take 300 mg by mouth daily.    cholecalciferol (DELTA D3) 10 MCG (400 UNIT) TABS  tablet Take by mouth.    diltiazem (CARDIZEM CD) 240 MG 24 hr capsule Take by mouth.    ELIQUIS 2.5 MG TABS tablet Take 2.5 mg by mouth 2 (two) times daily.     ferrous sulfate 325 (65 FE) MG EC tablet Take 325 mg by mouth once.    FOSAMAX 70 MG tablet     furosemide (LASIX) 40 MG tablet Take 40 mg by mouth.    metoprolol tartrate (LOPRESSOR) 25 MG tablet     Multiple Vitamins-Minerals (PRESERVISION AREDS 2 PO) Take by mouth.    oxyCODONE (OXY IR/ROXICODONE) 5 MG immediate release tablet Take 5 mg by mouth every 8 (eight) hours.    potassium chloride (K-DUR) 10 MEQ tablet Take 10 mEq by mouth daily.    sulfamethoxazole-trimethoprim (BACTRIM DS) 800-160 MG tablet Take 1 tablet by mouth 2 (two) times daily.    SYNTHROID 150 MCG tablet     Vitamin D, Cholecalciferol, 400 units CAPS Take 400 Units by mouth daily. 06/21/2015: Verified with pharmacy that patient still takes medication    No facility-administered encounter medications on file as of 12/27/2018.     Allergies: No Known Allergies  Family History: Family History  Problem Relation Age of Onset   Neurologic Disorder Mother    Heart disease Father    Cancer Daughter     Social History: Social History   Tobacco Use  Smoking status: Former Smoker    Quit date: 02/28/1968    Years since quitting: 50.8   Smokeless tobacco: Never Used  Substance Use Topics   Alcohol use: Yes    Alcohol/week: 2.0 standard drinks    Types: 2 Glasses of wine per week   Drug use: Never   Social History   Social History Narrative   Right handed   Assisted living home   4 grown children    Review of Systems:  CONSTITUTIONAL: No fevers, chills, night sweats, or weight loss.   EYES: No visual changes or eye pain ENT: No hearing changes.  No history of nose bleeds.   RESPIRATORY: No cough, wheezing and shortness of breath.   CARDIOVASCULAR: Negative for chest pain, and palpitations.   GI: Negative for abdominal discomfort,  blood in stools or black stools.  No recent change in bowel habits.   GU:  No history of incontinence.   MUSCLOSKELETAL: No history of joint pain or swelling.  No myalgias.   SKIN: Negative for lesions, rash, and itching.   HEMATOLOGY/ONCOLOGY: Negative for prolonged bleeding, bruising easily, and swollen nodes.  No history of cancer.   ENDOCRINE: Negative for cold or heat intolerance, polydipsia or goiter.   PSYCH:  No depression or anxiety symptoms.   NEURO: As Above.   Vital Signs:  BP 138/88    Pulse 74    Ht 5\' 10"  (1.778 m)    SpO2 98%    BMI 23.24 kg/m    General Medical Exam:   General:  Elderly appearing, comfortable, sitting in wheelchair.    Neurological Exam: MENTAL STATUS including orientation to time (2020), place, person, recent and remote memory, attention span and concentration, language, and fund of knowledge is fairly intact.  Speech is not dysarthric.  CRANIAL NERVES: II:  No visual field defects.   III-IV-VI: Pupils equal round and reactive to light.  Normal conjugate, extra-ocular eye movements in all directions of gaze.  No nystagmus.  Right ptosis (old).   V:  Normal facial sensation.    VII:  Normal facial symmetry and movements.   VIII:  Normal hearing and vestibular function.   IX-X:  Normal palatal movement.   XI:  Normal shoulder shrug and head rotation.   XII:  Normal tongue strength and range of motion, no deviation or fasciculation.  MOTOR:  Generalized loss of muscle bulk.  Mild left quad atrophy.  no fasciculations or abnormal movements.  No pronator drift.  Bilateral knee have limited knee extension (pt reports having prior surgery resulting in this)  Upper Extremity:  Right  Left  Deltoid  5/5   5/5   Biceps  5/5   5/5   Triceps  5/5   5/5   Infraspinatus 5/5  5/5  Medial pectoralis 5/5  5/5  Wrist extensors  5/5   5/5   Wrist flexors  5/5   5/5   Finger extensors  5/5   5/5   Finger flexors  5/5   5/5   Dorsal interossei  5/5   5/5     Abductor pollicis  5/5   5/5   Tone (Ashworth scale)  0  0   Lower Extremity:  Right  Left  Hip flexors  5/5   4-/5   Hip extensors  5/5   5/5   Adductor 5/5  4+/5  Abductor 5/5  5/5  Knee flexors  5/5   5/5   Knee extensors  5/5   5/5  Dorsiflexors  5/5   5/5   Plantarflexors  5/5   5/5   Toe extensors  5/5   5/5   Toe flexors  5/5   5/5   Tone (Ashworth scale)  0  0   MSRs:  Right        Left                  brachioradialis 2+  2+  biceps 2+  2+  triceps 2+  2+  patellar 2+  3+  ankle jerk 2+  2+  plantar response down  up  Crossed adductor to the left  SENSORY:  Normal and symmetric perception of light touch, pinprick, vibration.   COORDINATION/GAIT: Normal finger-to- nose-finger.  Finger tapping intact.  Gait not tested as patient is in wheelchair   IMPRESSION: Progressive gait instability due to left leg weakness, now non-ambulatory.  Exam is shows left hip flexor and adductor weakness, asymmetrically brisk patella reflex with preserved distal reflexes suggestive of lumbar canal stenosis and radiculopathy affecting L2-3 nerve roots on the left.     I had a long discussion with patient that his leg weakness is most likely stemming from degenerative change in the lumbar spine and treatment may be limited due to his advanced age and associated medical comorbidities. He has completed physical therapy with no improvement and I am not sure if he would be a candidate for surgical decompression, if there was severe degenerative changes.  Patient strongly desires to be able to walk again and would like to proceed with imaging and also ready to consider surgery.    PLAN/RECOMMENDATIONS:  MRI lumbar spine without contrast to evaluate for compressive pathology  Further recommendations pending results.  Thank you for allowing me to participate in patient's care.  If I can answer any additional questions, I would be pleased to do so.    Sincerely,    Evalisse Prajapati K. Posey Pronto, DO

## 2019-01-03 DIAGNOSIS — R319 Hematuria, unspecified: Secondary | ICD-10-CM | POA: Diagnosis not present

## 2019-01-03 DIAGNOSIS — N390 Urinary tract infection, site not specified: Secondary | ICD-10-CM | POA: Diagnosis not present

## 2019-01-03 DIAGNOSIS — R8290 Unspecified abnormal findings in urine: Secondary | ICD-10-CM | POA: Diagnosis not present

## 2019-01-08 DIAGNOSIS — R609 Edema, unspecified: Secondary | ICD-10-CM | POA: Diagnosis not present

## 2019-01-08 DIAGNOSIS — M109 Gout, unspecified: Secondary | ICD-10-CM | POA: Diagnosis not present

## 2019-01-08 DIAGNOSIS — D649 Anemia, unspecified: Secondary | ICD-10-CM | POA: Diagnosis not present

## 2019-01-08 DIAGNOSIS — M6281 Muscle weakness (generalized): Secondary | ICD-10-CM | POA: Diagnosis not present

## 2019-01-08 DIAGNOSIS — H9193 Unspecified hearing loss, bilateral: Secondary | ICD-10-CM | POA: Diagnosis not present

## 2019-01-08 DIAGNOSIS — I4891 Unspecified atrial fibrillation: Secondary | ICD-10-CM | POA: Diagnosis not present

## 2019-01-08 DIAGNOSIS — I10 Essential (primary) hypertension: Secondary | ICD-10-CM | POA: Diagnosis not present

## 2019-01-08 DIAGNOSIS — M810 Age-related osteoporosis without current pathological fracture: Secondary | ICD-10-CM | POA: Diagnosis not present

## 2019-01-08 DIAGNOSIS — E039 Hypothyroidism, unspecified: Secondary | ICD-10-CM | POA: Diagnosis not present

## 2019-01-08 DIAGNOSIS — Z7409 Other reduced mobility: Secondary | ICD-10-CM | POA: Diagnosis not present

## 2019-01-08 DIAGNOSIS — N390 Urinary tract infection, site not specified: Secondary | ICD-10-CM | POA: Diagnosis not present

## 2019-01-08 DIAGNOSIS — E559 Vitamin D deficiency, unspecified: Secondary | ICD-10-CM | POA: Diagnosis not present

## 2019-01-09 DIAGNOSIS — I4891 Unspecified atrial fibrillation: Secondary | ICD-10-CM | POA: Diagnosis not present

## 2019-01-09 DIAGNOSIS — M810 Age-related osteoporosis without current pathological fracture: Secondary | ICD-10-CM | POA: Diagnosis not present

## 2019-01-09 DIAGNOSIS — M1000 Idiopathic gout, unspecified site: Secondary | ICD-10-CM | POA: Diagnosis not present

## 2019-01-09 DIAGNOSIS — M6281 Muscle weakness (generalized): Secondary | ICD-10-CM | POA: Diagnosis not present

## 2019-01-09 DIAGNOSIS — L89223 Pressure ulcer of left hip, stage 3: Secondary | ICD-10-CM | POA: Diagnosis not present

## 2019-01-09 DIAGNOSIS — I10 Essential (primary) hypertension: Secondary | ICD-10-CM | POA: Diagnosis not present

## 2019-01-09 DIAGNOSIS — N390 Urinary tract infection, site not specified: Secondary | ICD-10-CM | POA: Diagnosis not present

## 2019-01-09 DIAGNOSIS — I69354 Hemiplegia and hemiparesis following cerebral infarction affecting left non-dominant side: Secondary | ICD-10-CM | POA: Diagnosis not present

## 2019-01-09 DIAGNOSIS — E039 Hypothyroidism, unspecified: Secondary | ICD-10-CM | POA: Diagnosis not present

## 2019-01-09 DIAGNOSIS — D649 Anemia, unspecified: Secondary | ICD-10-CM | POA: Diagnosis not present

## 2019-01-09 DIAGNOSIS — R609 Edema, unspecified: Secondary | ICD-10-CM | POA: Diagnosis not present

## 2019-01-13 DIAGNOSIS — I10 Essential (primary) hypertension: Secondary | ICD-10-CM | POA: Diagnosis not present

## 2019-01-13 DIAGNOSIS — G3184 Mild cognitive impairment, so stated: Secondary | ICD-10-CM | POA: Diagnosis not present

## 2019-01-13 DIAGNOSIS — M1000 Idiopathic gout, unspecified site: Secondary | ICD-10-CM | POA: Diagnosis not present

## 2019-01-13 DIAGNOSIS — I69354 Hemiplegia and hemiparesis following cerebral infarction affecting left non-dominant side: Secondary | ICD-10-CM | POA: Diagnosis not present

## 2019-01-13 DIAGNOSIS — L89223 Pressure ulcer of left hip, stage 3: Secondary | ICD-10-CM | POA: Diagnosis not present

## 2019-01-13 DIAGNOSIS — Z7409 Other reduced mobility: Secondary | ICD-10-CM | POA: Diagnosis not present

## 2019-01-13 DIAGNOSIS — M810 Age-related osteoporosis without current pathological fracture: Secondary | ICD-10-CM | POA: Diagnosis not present

## 2019-01-13 DIAGNOSIS — M6281 Muscle weakness (generalized): Secondary | ICD-10-CM | POA: Diagnosis not present

## 2019-01-13 DIAGNOSIS — E559 Vitamin D deficiency, unspecified: Secondary | ICD-10-CM | POA: Diagnosis not present

## 2019-01-14 DIAGNOSIS — Z03818 Encounter for observation for suspected exposure to other biological agents ruled out: Secondary | ICD-10-CM | POA: Diagnosis not present

## 2019-01-17 DIAGNOSIS — M810 Age-related osteoporosis without current pathological fracture: Secondary | ICD-10-CM | POA: Diagnosis not present

## 2019-01-17 DIAGNOSIS — G3184 Mild cognitive impairment, so stated: Secondary | ICD-10-CM | POA: Diagnosis not present

## 2019-01-17 DIAGNOSIS — R609 Edema, unspecified: Secondary | ICD-10-CM | POA: Diagnosis not present

## 2019-01-17 DIAGNOSIS — E559 Vitamin D deficiency, unspecified: Secondary | ICD-10-CM | POA: Diagnosis not present

## 2019-01-17 DIAGNOSIS — I69354 Hemiplegia and hemiparesis following cerebral infarction affecting left non-dominant side: Secondary | ICD-10-CM | POA: Diagnosis not present

## 2019-01-17 DIAGNOSIS — L89223 Pressure ulcer of left hip, stage 3: Secondary | ICD-10-CM | POA: Diagnosis not present

## 2019-01-17 DIAGNOSIS — M6281 Muscle weakness (generalized): Secondary | ICD-10-CM | POA: Diagnosis not present

## 2019-01-17 DIAGNOSIS — I4891 Unspecified atrial fibrillation: Secondary | ICD-10-CM | POA: Diagnosis not present

## 2019-01-17 DIAGNOSIS — I10 Essential (primary) hypertension: Secondary | ICD-10-CM | POA: Diagnosis not present

## 2019-01-17 DIAGNOSIS — N390 Urinary tract infection, site not specified: Secondary | ICD-10-CM | POA: Diagnosis not present

## 2019-01-17 DIAGNOSIS — M1000 Idiopathic gout, unspecified site: Secondary | ICD-10-CM | POA: Diagnosis not present

## 2019-01-17 DIAGNOSIS — Z7409 Other reduced mobility: Secondary | ICD-10-CM | POA: Diagnosis not present

## 2019-01-21 DIAGNOSIS — Z1159 Encounter for screening for other viral diseases: Secondary | ICD-10-CM | POA: Diagnosis not present

## 2019-01-22 ENCOUNTER — Other Ambulatory Visit: Payer: Self-pay

## 2019-01-22 ENCOUNTER — Ambulatory Visit
Admission: RE | Admit: 2019-01-22 | Discharge: 2019-01-22 | Disposition: A | Discharge status: Home / Self Care | Payer: Medicare Other | Source: Ambulatory Visit | Attending: Neurology | Admitting: Neurology

## 2019-01-22 DIAGNOSIS — M5126 Other intervertebral disc displacement, lumbar region: Secondary | ICD-10-CM | POA: Diagnosis not present

## 2019-01-22 DIAGNOSIS — M48061 Spinal stenosis, lumbar region without neurogenic claudication: Secondary | ICD-10-CM

## 2019-01-22 DIAGNOSIS — G959 Disease of spinal cord, unspecified: Secondary | ICD-10-CM

## 2019-01-22 DIAGNOSIS — R29898 Other symptoms and signs involving the musculoskeletal system: Secondary | ICD-10-CM

## 2019-01-27 ENCOUNTER — Telehealth: Payer: Self-pay | Admitting: Neurology

## 2019-01-27 NOTE — Telephone Encounter (Signed)
No answer at 400

## 2019-01-27 NOTE — Telephone Encounter (Signed)
Patient's contact, Kona Bartok (okay to share PHI per DPR on file), called requesting recent MRI results.

## 2019-01-27 NOTE — Telephone Encounter (Signed)
Pt called fax [phone]

## 2019-01-27 NOTE — Telephone Encounter (Signed)
Patient's contact returned call to [place].

## 2019-01-27 NOTE — Telephone Encounter (Signed)
Not resulted yet

## 2019-01-28 DIAGNOSIS — Z03818 Encounter for observation for suspected exposure to other biological agents ruled out: Secondary | ICD-10-CM | POA: Diagnosis not present

## 2019-01-29 ENCOUNTER — Telehealth: Payer: Self-pay

## 2019-01-29 DIAGNOSIS — M5416 Radiculopathy, lumbar region: Secondary | ICD-10-CM

## 2019-01-29 DIAGNOSIS — R29898 Other symptoms and signs involving the musculoskeletal system: Secondary | ICD-10-CM

## 2019-01-29 NOTE — Telephone Encounter (Signed)
Pt requesting mri report, please [phone].

## 2019-01-30 NOTE — Telephone Encounter (Signed)
Referral faxed

## 2019-01-30 NOTE — Addendum Note (Signed)
Addended by: Amado Coe on: 01/30/2019 01:39 PM   Modules accepted: Orders

## 2019-01-30 NOTE — Telephone Encounter (Signed)
Returned call to patient's POA Samuel Shaffer) and discussed that there is evidence of disc herniation causing left L2 nerve root impingement at the L2-3 level, which is causing his proximal leg weaknessl. He has tried PT with no improvement.  Management options are limited and he may not be a candidate for surgery due to medical comorbidities, however, he has expressed keen interest in seeking surgical opinion.  Referral will be sent.   To a lesser degree, foraminal disc protrusion is seen at the right L5-S1 level.  3.3 cm AAA is also seen, patient is being followed for this.

## 2019-01-31 ENCOUNTER — Telehealth: Payer: Self-pay

## 2019-01-31 ENCOUNTER — Telehealth: Payer: Self-pay | Admitting: Neurology

## 2019-01-31 NOTE — Telephone Encounter (Signed)
Spoke with Leda Gauze and release form will be faxed and mychart link sent.  Once received results can be sent.

## 2019-01-31 NOTE — Telephone Encounter (Signed)
Patient wife left a message on the VM about the MRI she has not gotten and wants a call back

## 2019-01-31 NOTE — Telephone Encounter (Signed)
-----   Message from Alda Berthold, DO sent at 01/30/2019  2:41 PM EST ----- Patient's POA Georgiana Shore is requesting this MRI lumbar spine report faxed to her at [phone].  In order to do this, please inform that we need signed medical release (ok to fax medical release form), once rec'd we can fax the report.  Thanks.

## 2019-01-31 NOTE — Telephone Encounter (Signed)
Patients wife filled out a medical release form and faxed it to the office this morning. She would like you to please fax back to her the MRI paperwork. Please Call. Thanks

## 2019-01-31 NOTE — Telephone Encounter (Signed)
MRI refaxed to [phone].  Patient aware.

## 2019-02-03 DIAGNOSIS — M5126 Other intervertebral disc displacement, lumbar region: Secondary | ICD-10-CM | POA: Diagnosis not present

## 2019-02-19 DIAGNOSIS — Z1159 Encounter for screening for other viral diseases: Secondary | ICD-10-CM | POA: Diagnosis not present

## 2019-02-24 DIAGNOSIS — Z03818 Encounter for observation for suspected exposure to other biological agents ruled out: Secondary | ICD-10-CM | POA: Diagnosis not present

## 2019-03-03 ENCOUNTER — Other Ambulatory Visit: Payer: Self-pay

## 2019-03-03 ENCOUNTER — Emergency Department (HOSPITAL_COMMUNITY): Payer: Medicare Other

## 2019-03-03 ENCOUNTER — Encounter (HOSPITAL_COMMUNITY): Payer: Self-pay | Admitting: *Deleted

## 2019-03-03 ENCOUNTER — Inpatient Hospital Stay (HOSPITAL_COMMUNITY)
Admission: EM | Admit: 2019-03-03 | Discharge: 2019-03-31 | DRG: 871 | Disposition: E | Payer: Medicare Other | Source: Skilled Nursing Facility | Attending: Family Medicine | Admitting: Family Medicine

## 2019-03-03 DIAGNOSIS — I480 Paroxysmal atrial fibrillation: Secondary | ICD-10-CM | POA: Diagnosis present

## 2019-03-03 DIAGNOSIS — N3001 Acute cystitis with hematuria: Secondary | ICD-10-CM

## 2019-03-03 DIAGNOSIS — H905 Unspecified sensorineural hearing loss: Secondary | ICD-10-CM | POA: Diagnosis present

## 2019-03-03 DIAGNOSIS — U071 COVID-19: Secondary | ICD-10-CM | POA: Diagnosis present

## 2019-03-03 DIAGNOSIS — M109 Gout, unspecified: Secondary | ICD-10-CM | POA: Diagnosis present

## 2019-03-03 DIAGNOSIS — E039 Hypothyroidism, unspecified: Secondary | ICD-10-CM | POA: Diagnosis present

## 2019-03-03 DIAGNOSIS — G9341 Metabolic encephalopathy: Secondary | ICD-10-CM | POA: Diagnosis not present

## 2019-03-03 DIAGNOSIS — D6489 Other specified anemias: Secondary | ICD-10-CM | POA: Diagnosis present

## 2019-03-03 DIAGNOSIS — R509 Fever, unspecified: Secondary | ICD-10-CM | POA: Diagnosis not present

## 2019-03-03 DIAGNOSIS — Z7983 Long term (current) use of bisphosphonates: Secondary | ICD-10-CM

## 2019-03-03 DIAGNOSIS — A0839 Other viral enteritis: Secondary | ICD-10-CM | POA: Diagnosis present

## 2019-03-03 DIAGNOSIS — K7200 Acute and subacute hepatic failure without coma: Secondary | ICD-10-CM | POA: Diagnosis present

## 2019-03-03 DIAGNOSIS — E785 Hyperlipidemia, unspecified: Secondary | ICD-10-CM | POA: Diagnosis present

## 2019-03-03 DIAGNOSIS — Z87891 Personal history of nicotine dependence: Secondary | ICD-10-CM

## 2019-03-03 DIAGNOSIS — R0602 Shortness of breath: Secondary | ICD-10-CM

## 2019-03-03 DIAGNOSIS — I456 Pre-excitation syndrome: Secondary | ICD-10-CM | POA: Diagnosis not present

## 2019-03-03 DIAGNOSIS — J1282 Pneumonia due to coronavirus disease 2019: Secondary | ICD-10-CM | POA: Diagnosis present

## 2019-03-03 DIAGNOSIS — R6520 Severe sepsis without septic shock: Secondary | ICD-10-CM | POA: Diagnosis present

## 2019-03-03 DIAGNOSIS — I714 Abdominal aortic aneurysm, without rupture, unspecified: Secondary | ICD-10-CM | POA: Diagnosis present

## 2019-03-03 DIAGNOSIS — Z8249 Family history of ischemic heart disease and other diseases of the circulatory system: Secondary | ICD-10-CM

## 2019-03-03 DIAGNOSIS — Z79899 Other long term (current) drug therapy: Secondary | ICD-10-CM

## 2019-03-03 DIAGNOSIS — G931 Anoxic brain damage, not elsewhere classified: Secondary | ICD-10-CM | POA: Diagnosis not present

## 2019-03-03 DIAGNOSIS — N390 Urinary tract infection, site not specified: Secondary | ICD-10-CM | POA: Diagnosis present

## 2019-03-03 DIAGNOSIS — R0902 Hypoxemia: Secondary | ICD-10-CM | POA: Diagnosis not present

## 2019-03-03 DIAGNOSIS — A419 Sepsis, unspecified organism: Secondary | ICD-10-CM | POA: Diagnosis present

## 2019-03-03 DIAGNOSIS — E871 Hypo-osmolality and hyponatremia: Secondary | ICD-10-CM | POA: Diagnosis present

## 2019-03-03 DIAGNOSIS — I248 Other forms of acute ischemic heart disease: Secondary | ICD-10-CM | POA: Diagnosis present

## 2019-03-03 DIAGNOSIS — E861 Hypovolemia: Secondary | ICD-10-CM | POA: Diagnosis present

## 2019-03-03 DIAGNOSIS — L8990 Pressure ulcer of unspecified site, unspecified stage: Secondary | ICD-10-CM | POA: Insufficient documentation

## 2019-03-03 DIAGNOSIS — R0689 Other abnormalities of breathing: Secondary | ICD-10-CM | POA: Diagnosis not present

## 2019-03-03 DIAGNOSIS — A4189 Other specified sepsis: Secondary | ICD-10-CM | POA: Diagnosis present

## 2019-03-03 DIAGNOSIS — N179 Acute kidney failure, unspecified: Secondary | ICD-10-CM | POA: Diagnosis not present

## 2019-03-03 DIAGNOSIS — J9601 Acute respiratory failure with hypoxia: Secondary | ICD-10-CM | POA: Diagnosis present

## 2019-03-03 DIAGNOSIS — Z87440 Personal history of urinary (tract) infections: Secondary | ICD-10-CM

## 2019-03-03 DIAGNOSIS — R000 Tachycardia, unspecified: Secondary | ICD-10-CM | POA: Diagnosis not present

## 2019-03-03 DIAGNOSIS — E782 Mixed hyperlipidemia: Secondary | ICD-10-CM | POA: Diagnosis present

## 2019-03-03 DIAGNOSIS — D509 Iron deficiency anemia, unspecified: Secondary | ICD-10-CM | POA: Diagnosis present

## 2019-03-03 DIAGNOSIS — Z515 Encounter for palliative care: Secondary | ICD-10-CM | POA: Diagnosis not present

## 2019-03-03 DIAGNOSIS — Z8673 Personal history of transient ischemic attack (TIA), and cerebral infarction without residual deficits: Secondary | ICD-10-CM

## 2019-03-03 DIAGNOSIS — I10 Essential (primary) hypertension: Secondary | ICD-10-CM | POA: Diagnosis present

## 2019-03-03 DIAGNOSIS — B9689 Other specified bacterial agents as the cause of diseases classified elsewhere: Secondary | ICD-10-CM | POA: Diagnosis present

## 2019-03-03 DIAGNOSIS — Z66 Do not resuscitate: Secondary | ICD-10-CM | POA: Diagnosis not present

## 2019-03-03 DIAGNOSIS — Z7901 Long term (current) use of anticoagulants: Secondary | ICD-10-CM

## 2019-03-03 DIAGNOSIS — Z8551 Personal history of malignant neoplasm of bladder: Secondary | ICD-10-CM

## 2019-03-03 DIAGNOSIS — Z79890 Hormone replacement therapy (postmenopausal): Secondary | ICD-10-CM

## 2019-03-03 DIAGNOSIS — F419 Anxiety disorder, unspecified: Secondary | ICD-10-CM | POA: Diagnosis present

## 2019-03-03 DIAGNOSIS — L89152 Pressure ulcer of sacral region, stage 2: Secondary | ICD-10-CM | POA: Diagnosis present

## 2019-03-03 DIAGNOSIS — E875 Hyperkalemia: Secondary | ICD-10-CM | POA: Diagnosis not present

## 2019-03-03 HISTORY — DX: Pressure ulcer of unspecified site, unspecified stage: L89.90

## 2019-03-03 LAB — URINALYSIS, ROUTINE W REFLEX MICROSCOPIC
Bilirubin Urine: NEGATIVE
Glucose, UA: NEGATIVE mg/dL
Ketones, ur: 15 mg/dL — AB
Nitrite: NEGATIVE
Protein, ur: 100 mg/dL — AB
Specific Gravity, Urine: 1.025 (ref 1.005–1.030)
pH: 6 (ref 5.0–8.0)

## 2019-03-03 LAB — FERRITIN: Ferritin: 289 ng/mL (ref 24–336)

## 2019-03-03 LAB — CBC WITH DIFFERENTIAL/PLATELET
Abs Immature Granulocytes: 0.06 10*3/uL (ref 0.00–0.07)
Basophils Absolute: 0 10*3/uL (ref 0.0–0.1)
Basophils Relative: 0 %
Eosinophils Absolute: 0 10*3/uL (ref 0.0–0.5)
Eosinophils Relative: 0 %
HCT: 32.7 % — ABNORMAL LOW (ref 39.0–52.0)
Hemoglobin: 10.3 g/dL — ABNORMAL LOW (ref 13.0–17.0)
Immature Granulocytes: 1 %
Lymphocytes Relative: 10 %
Lymphs Abs: 0.7 10*3/uL (ref 0.7–4.0)
MCH: 25.3 pg — ABNORMAL LOW (ref 26.0–34.0)
MCHC: 31.5 g/dL (ref 30.0–36.0)
MCV: 80.3 fL (ref 80.0–100.0)
Monocytes Absolute: 0.3 10*3/uL (ref 0.1–1.0)
Monocytes Relative: 5 %
Neutro Abs: 6.5 10*3/uL (ref 1.7–7.7)
Neutrophils Relative %: 84 %
Platelets: 251 10*3/uL (ref 150–400)
RBC: 4.07 MIL/uL — ABNORMAL LOW (ref 4.22–5.81)
RDW: 18 % — ABNORMAL HIGH (ref 11.5–15.5)
WBC: 7.6 10*3/uL (ref 4.0–10.5)
nRBC: 0 % (ref 0.0–0.2)

## 2019-03-03 LAB — POCT I-STAT 7, (LYTES, BLD GAS, ICA,H+H)
Acid-base deficit: 3 mmol/L — ABNORMAL HIGH (ref 0.0–2.0)
Acid-base deficit: 5 mmol/L — ABNORMAL HIGH (ref 0.0–2.0)
Bicarbonate: 17.5 mmol/L — ABNORMAL LOW (ref 20.0–28.0)
Bicarbonate: 19.3 mmol/L — ABNORMAL LOW (ref 20.0–28.0)
Calcium, Ion: 0.97 mmol/L — ABNORMAL LOW (ref 1.15–1.40)
Calcium, Ion: 1.07 mmol/L — ABNORMAL LOW (ref 1.15–1.40)
HCT: 33 % — ABNORMAL LOW (ref 39.0–52.0)
HCT: 37 % — ABNORMAL LOW (ref 39.0–52.0)
Hemoglobin: 11.2 g/dL — ABNORMAL LOW (ref 13.0–17.0)
Hemoglobin: 12.6 g/dL — ABNORMAL LOW (ref 13.0–17.0)
O2 Saturation: 78 %
O2 Saturation: 82 %
Patient temperature: 101.9
Patient temperature: 98
Potassium: 3.9 mmol/L (ref 3.5–5.1)
Potassium: 4.2 mmol/L (ref 3.5–5.1)
Sodium: 136 mmol/L (ref 135–145)
Sodium: 139 mmol/L (ref 135–145)
TCO2: 18 mmol/L — ABNORMAL LOW (ref 22–32)
TCO2: 20 mmol/L — ABNORMAL LOW (ref 22–32)
pCO2 arterial: 26.5 mmHg — ABNORMAL LOW (ref 32.0–48.0)
pCO2 arterial: 26.8 mmHg — ABNORMAL LOW (ref 32.0–48.0)
pH, Arterial: 7.436 (ref 7.350–7.450)
pH, Arterial: 7.464 — ABNORMAL HIGH (ref 7.350–7.450)
pO2, Arterial: 42 mmHg — ABNORMAL LOW (ref 83.0–108.0)
pO2, Arterial: 44 mmHg — ABNORMAL LOW (ref 83.0–108.0)

## 2019-03-03 LAB — URINALYSIS, MICROSCOPIC (REFLEX): WBC, UA: >50 WBC/hpf (ref 0–5)

## 2019-03-03 LAB — TYPE AND SCREEN
ABO/RH(D): O POS
Antibody Screen: NEGATIVE

## 2019-03-03 LAB — COMPREHENSIVE METABOLIC PANEL
ALT: 33 U/L (ref 0–44)
AST: 115 U/L — ABNORMAL HIGH (ref 15–41)
Albumin: 2.2 g/dL — ABNORMAL LOW (ref 3.5–5.0)
Alkaline Phosphatase: 77 U/L (ref 38–126)
Anion gap: 11 (ref 5–15)
BUN: 45 mg/dL — ABNORMAL HIGH (ref 8–23)
CO2: 21 mmol/L — ABNORMAL LOW (ref 22–32)
Calcium: 7.3 mg/dL — ABNORMAL LOW (ref 8.9–10.3)
Chloride: 101 mmol/L (ref 98–111)
Creatinine, Ser: 1.76 mg/dL — ABNORMAL HIGH (ref 0.61–1.24)
GFR calc Af Amer: 39 mL/min — ABNORMAL LOW (ref >60)
GFR calc non Af Amer: 33 mL/min — ABNORMAL LOW (ref >60)
Glucose, Bld: 100 mg/dL — ABNORMAL HIGH (ref 70–99)
Potassium: 4.3 mmol/L (ref 3.5–5.1)
Sodium: 133 mmol/L — ABNORMAL LOW (ref 135–145)
Total Bilirubin: 1.2 mg/dL (ref 0.3–1.2)
Total Protein: 5.6 g/dL — ABNORMAL LOW (ref 6.5–8.1)

## 2019-03-03 LAB — BRAIN NATRIURETIC PEPTIDE
B Natriuretic Peptide: 165.5 pg/mL — ABNORMAL HIGH (ref 0.0–100.0)
B Natriuretic Peptide: 230.4 pg/mL — ABNORMAL HIGH (ref 0.0–100.0)

## 2019-03-03 LAB — TSH: TSH: 16.607 u[IU]/mL — ABNORMAL HIGH (ref 0.350–4.500)

## 2019-03-03 LAB — C-REACTIVE PROTEIN: CRP: 12.9 mg/dL — ABNORMAL HIGH (ref <1.0)

## 2019-03-03 LAB — HEPATITIS B SURFACE ANTIGEN: Hepatitis B Surface Ag: NONREACTIVE

## 2019-03-03 LAB — TROPONIN I (HIGH SENSITIVITY)
Troponin I (High Sensitivity): 77 ng/L — ABNORMAL HIGH (ref <18)
Troponin I (High Sensitivity): 71 ng/L — ABNORMAL HIGH (ref <18)

## 2019-03-03 LAB — LACTIC ACID, PLASMA
Lactic Acid, Venous: 1.9 mmol/L (ref 0.5–1.9)
Lactic Acid, Venous: 2.1 mmol/L (ref 0.5–1.9)

## 2019-03-03 LAB — POC SARS CORONAVIRUS 2 AG -  ED: SARS Coronavirus 2 Ag: POSITIVE — AB

## 2019-03-03 LAB — LIPASE, BLOOD: Lipase: 59 U/L — ABNORMAL HIGH (ref 11–51)

## 2019-03-03 LAB — FIBRINOGEN: Fibrinogen: 515 mg/dL — ABNORMAL HIGH (ref 210–475)

## 2019-03-03 LAB — ABO/RH: ABO/RH(D): O POS

## 2019-03-03 LAB — D-DIMER, QUANTITATIVE: D-Dimer, Quant: 3.51 ug/mL-FEU — ABNORMAL HIGH (ref 0.00–0.50)

## 2019-03-03 LAB — LACTATE DEHYDROGENASE: LDH: 461 U/L — ABNORMAL HIGH (ref 98–192)

## 2019-03-03 LAB — PROCALCITONIN: Procalcitonin: 0.45 ng/mL

## 2019-03-03 MED ORDER — ONDANSETRON HCL 4 MG PO TABS: 4.0000 mg | ORAL_TABLET | Freq: Four times a day (QID) | ORAL | Status: DC | PRN

## 2019-03-03 MED ORDER — APIXABAN 2.5 MG PO TABS
2.5000 mg | ORAL_TABLET | Freq: Two times a day (BID) | ORAL | Status: DC
  Administered 2019-03-03: 2.5 mg via ORAL
  Filled 2019-03-03 (×4): qty 1

## 2019-03-03 MED ORDER — GUAIFENESIN-DM 100-10 MG/5ML PO SYRP: 10.0000 mL | ORAL_SOLUTION | ORAL | Status: DC | PRN

## 2019-03-03 MED ORDER — SODIUM CHLORIDE 0.9 % IV BOLUS
500.0000 mL | Freq: Once | INTRAVENOUS | Status: AC
  Administered 2019-03-03: 500 mL via INTRAVENOUS

## 2019-03-03 MED ORDER — SODIUM CHLORIDE 0.9 % IV SOLN
1.0000 g | INTRAVENOUS | Status: DC
  Administered 2019-03-03 – 2019-03-05 (×3): 1 g via INTRAVENOUS
  Filled 2019-03-03 (×2): qty 10
  Filled 2019-03-03: qty 1

## 2019-03-03 MED ORDER — ONDANSETRON HCL 4 MG/2ML IJ SOLN: 4.0000 mg | Freq: Four times a day (QID) | INTRAMUSCULAR | Status: DC | PRN

## 2019-03-03 MED ORDER — DEXAMETHASONE SODIUM PHOSPHATE 10 MG/ML IJ SOLN
6.0000 mg | INTRAMUSCULAR | Status: DC
  Administered 2019-03-03 – 2019-03-05 (×3): 6 mg via INTRAVENOUS
  Filled 2019-03-03 (×3): qty 1

## 2019-03-03 MED ORDER — ACETAMINOPHEN 325 MG PO TABS
650.0000 mg | ORAL_TABLET | Freq: Four times a day (QID) | ORAL | Status: DC | PRN
  Administered 2019-03-04: 650 mg via ORAL
  Filled 2019-03-03: qty 2

## 2019-03-03 MED ORDER — FAMOTIDINE 20 MG PO TABS
20.0000 mg | ORAL_TABLET | Freq: Every day | ORAL | Status: DC
  Administered 2019-03-03: 20 mg via ORAL
  Filled 2019-03-03: qty 1

## 2019-03-03 MED ORDER — SODIUM CHLORIDE 0.9 % IV SOLN: INTRAVENOUS | Status: DC

## 2019-03-03 MED ORDER — HYDROCOD POLST-CPM POLST ER 10-8 MG/5ML PO SUER: 5.0000 mL | Freq: Two times a day (BID) | ORAL | Status: DC | PRN

## 2019-03-03 MED ORDER — FENTANYL CITRATE (PF) 100 MCG/2ML IJ SOLN: 50.0000 ug | Freq: Once | INTRAMUSCULAR | Status: DC

## 2019-03-03 MED ORDER — SODIUM CHLORIDE 0.9 % IV SOLN
200.0000 mg | Freq: Once | INTRAVENOUS | Status: AC
  Administered 2019-03-03: 200 mg via INTRAVENOUS
  Filled 2019-03-03: qty 40

## 2019-03-03 MED ORDER — SODIUM CHLORIDE 0.9 % IV SOLN
100.0000 mg | Freq: Every day | INTRAVENOUS | Status: DC
  Administered 2019-03-04 – 2019-03-05 (×2): 100 mg via INTRAVENOUS
  Filled 2019-03-03 (×3): qty 20

## 2019-03-03 MED ORDER — ASCORBIC ACID 500 MG PO TABS
500.0000 mg | ORAL_TABLET | Freq: Every day | ORAL | Status: DC
  Administered 2019-03-03: 500 mg via ORAL
  Filled 2019-03-03: qty 1

## 2019-03-03 MED ORDER — ALBUTEROL SULFATE HFA 108 (90 BASE) MCG/ACT IN AERS
2.0000 | INHALATION_SPRAY | Freq: Four times a day (QID) | RESPIRATORY_TRACT | Status: DC
  Administered 2019-03-03: 2 via RESPIRATORY_TRACT
  Filled 2019-03-03: qty 6.7

## 2019-03-03 MED ORDER — SODIUM CHLORIDE 0.9% FLUSH
3.0000 mL | Freq: Two times a day (BID) | INTRAVENOUS | Status: DC
  Administered 2019-03-03 – 2019-03-05 (×4): 3 mL via INTRAVENOUS

## 2019-03-03 MED ORDER — ZINC SULFATE 220 (50 ZN) MG PO CAPS
220.0000 mg | ORAL_CAPSULE | Freq: Every day | ORAL | Status: DC
  Administered 2019-03-03: 220 mg via ORAL
  Filled 2019-03-03: qty 1

## 2019-03-03 NOTE — ED Notes (Signed)
Report called to [facility] to RN.

## 2019-03-03 NOTE — Progress Notes (Signed)
Pt visibly struggling to breathe with belly breathing and accessory muscle usage.  Pt unable to complete a sentence, but is in good spirits but this RT unsure of his base line before this event.  Pt may need to transfer to ICU d/t increased O2 needs and support.  ABG drawn at this time to determine plan of care.

## 2019-03-03 NOTE — ED Notes (Signed)
Got patient on the monitor patient is resting with nurse at bedside and call bell in reach 

## 2019-03-03 NOTE — ED Notes (Signed)
Pt removed own NRB, sats dropped to 96s. Replaced and breathing coached. Improved to mid 80s, where he has been sitting

## 2019-03-03 NOTE — ED Notes (Signed)
Complete bed change provided, fresh gown and chux applied to pt. Pt placed on hospital bed for comfort, repositioned off of reddened area on coccyx. Pt voiced comfort. External catheter placed on pt, pt educated as to usage of catheter. Pt remains on 15LPM NRB, sats anywhere from 88%-95% exertionally dependent. Pt given several sips of water. Denies any other needs at this time. Pt placed in position of comfort.

## 2019-03-03 NOTE — H&P (Addendum)
History and Physical    Samuel Shaffer [id number] DOB: 05/25/1926 DOA: 03/24/2019  Referring MD/NP/PA: Marda Stalker, MD PCP: [name], MD  Patient coming from: [facility] was via EMS  Chief Complaint: Fever and shortness of breath  I have personally briefly reviewed patient's old medical records in [place]   HPI: Samuel Shaffer is a 86 y.o. male with medical history significant of hypertension, hyperlipidemia, WPW, paroxysmal atrial fibrillation on chronic anticoagulation, bladder cancer s/p resection, frequent UTIs, AAA, and hypothyroidism.  Patient presents with complaints of fever and shortness of breath.  History is somewhat limited due to patient being hard of hearing and his respiratory distress.  He reports symptoms have been present for several days now.  Notes associated symptoms of malaise and dysuria.  Denies having any chest pain, diarrhea, nausea, or vomiting. En route with EMS patient was noted to be febrile up to 103 F with O2 saturations 87% on 6 L.  He had been given 650 mg of Tylenol prior to arrival.  ED Course: Upon admission into the emergency department patient was noted to be afebrile, pulse 49-139, respirations 16-30, blood pressures maintained, and O2 saturations as low as 80-92% on a nonrebreather at 15 L.  Labs significant for WBC 7.6, hemoglobin 10.3, sodium 133, BUN 45, creatinine 1.76, troponin 77, and lactic acid 1.9. Urinalysis positive for moderate leukocytes, negative nitrites, large hemoglobin, many bacteria, and wbc>50.  Chest x-ray showing a new left-sided patchy opacity concerning for pneumonia.  Patient has been given 500 mL of normal saline.  Patient was noted to have several episodes of liquid diarrhea while in the ED.  Review of Systems  Constitutional: Positive for fever and malaise/fatigue.  HENT: Negative for ear discharge.   Eyes: Negative for photophobia and pain.  Respiratory: Positive for shortness of breath.    Cardiovascular: Negative for chest pain and leg swelling.  Gastrointestinal: Negative for abdominal pain, nausea and vomiting.  Genitourinary: Positive for dysuria.  Neurological: Negative for loss of consciousness.  Psychiatric/Behavioral: Negative for memory loss and substance abuse.    Past Medical History:  Diagnosis Date  . A-fib ([place])   . Abdominal aortic aneurysm (AAA) ([place])   . Anemia   . Arthritis   . Bladder cancer [facility])    s/p resection  . Closed left acetabular fracture ([place])   . Gout   . History of GI diverticular bleed   . Hyperlipidemia   . Hypertension   . Hypothyroidism   . Pressure ulcer   . Stroke ([place])   . Wolff-Parkinson-White (WPW) pattern     Past Surgical History:  Procedure Laterality Date  . CATARACT EXTRACTION    . CHOLECYSTECTOMY    . CYSTOURETHROSCOPY  06/18/2014   W/Fulguration &/Or Resection Bladder Tumor  . KNEE SURGERY    . TONSILLECTOMY       reports that he quit smoking about 51 years ago. He has never used smokeless tobacco. He reports current alcohol use of about 2.0 standard drinks of alcohol per week. He reports that he does not use drugs.  No Known Allergies  Family History  Problem Relation Age of Onset  . Neurologic Disorder Mother   . Heart disease Father   . Cancer Daughter     Prior to Admission medications   Medication Sig Start Date End Date Taking? Authorizing Provider  allopurinol (ZYLOPRIM) 300 MG tablet Take 300 mg by mouth daily. 05/31/15   [provider]  cholecalciferol (DELTA D3) 10  MCG (400 UNIT) TABS tablet Take by mouth.    [provider]  diltiazem (CARDIZEM CD) 240 MG 24 hr capsule Take by mouth.    [provider]  ELIQUIS 2.5 MG TABS tablet Take 2.5 mg by mouth 2 (two) times daily.  05/27/15   [provider]  ferrous sulfate 325 (65 FE) MG EC tablet Take 325 mg by mouth once.    [provider]  FOSAMAX 70 MG tablet  08/01/18   [provider]   furosemide (LASIX) 40 MG tablet Take 40 mg by mouth.    [provider]  metoprolol tartrate (LOPRESSOR) 25 MG tablet  08/17/18   [provider]  Multiple Vitamins-Minerals (PRESERVISION AREDS 2 PO) Take by mouth.    [provider]  oxyCODONE (OXY IR/ROXICODONE) 5 MG immediate release tablet Take 5 mg by mouth every 8 (eight) hours.    [provider]  potassium chloride (K-DUR) 10 MEQ tablet Take 10 mEq by mouth daily.    [provider]  sulfamethoxazole-trimethoprim (BACTRIM DS) 800-160 MG tablet Take 1 tablet by mouth 2 (two) times daily.    [provider]  SYNTHROID 150 MCG tablet  09/01/18   [provider]  Vitamin D, Cholecalciferol, 400 units CAPS Take 400 Units by mouth daily.    [provider]    Physical Exam:  Constitutional: Elderly male who appears acutely ill and respiratory distress Vitals:   03/21/2019 1000 03/20/2019 1015 03/24/2019 1030 03/05/2019 1045  BP: 98/66 99/77 (!) 111/55 107/82  Pulse: (!) 49 68 (!) 139 74  Resp: (!) 23 16 (!) 30 (!) 29  Temp:      TempSrc:      SpO2: (!) 83% (!) 87% (!) 84% (!) 80%  Weight:      Height:       Eyes: PERRL, lids and conjunctivae normal ENMT: Mucous membranes are dry. Posterior pharynx clear of any exudate or lesions. Hearing aids present. Neck: normal, supple, no masses, no thyromegaly Respiratory: Tachypneic with decreased aeration.  Patient on nonrebreather at this time only able to talk in short 2-3 word sentences. Cardiovascular: Regular rate and rhythm, no murmurs / rubs / gallops. No extremity edema. 2+ pedal pulses. No carotid bruits.  Abdomen: no tenderness, no masses palpated. No hepatosplenomegaly. Bowel sounds positive.  Musculoskeletal: no clubbing / cyanosis. No joint deformity upper and lower extremities. Good ROM, no contractures. Normal muscle tone.  Skin: no rashes, lesions, ulcers. No induration Neurologic: CN 2-12 grossly intact. Sensation  intact, DTR normal. Strength 5/5 in all 4.  Psychiatric: Normal judgment and insight. Alert and oriented x 3. Normal mood.     Labs on Admission: I have personally reviewed following labs and imaging studies  CBC: Recent Labs  Lab 03/14/2019 0912  WBC 7.6  NEUTROABS 6.5  HGB 10.3*  HCT 32.7*  MCV 80.3  PLT 123XX123   Basic Metabolic Panel: Recent Labs  Lab 03/07/2019 0912  NA 133*  K 4.3  CL 101  CO2 21*  GLUCOSE 100*  BUN 45*  CREATININE 1.76*  CALCIUM 7.3*   GFR: Estimated Creatinine Clearance: 28.8 mL/min (A) (by C-G formula based on SCr of 1.76 mg/dL (H)). Liver Function Tests: Recent Labs  Lab 03/22/2019 0912  AST 115*  ALT 33  ALKPHOS 77  BILITOT 1.2  PROT 5.6*  ALBUMIN 2.2*   Recent Labs  Lab 03/01/2019 0912  LIPASE 59*   No results for input(s): AMMONIA in the last  168 hours. Coagulation Profile: No results for input(s): INR, PROTIME in the last 168 hours. Cardiac Enzymes: No results for input(s): CKTOTAL, CKMB, CKMBINDEX, TROPONINI in the last 168 hours. BNP (last 3 results) No results for input(s): PROBNP in the last 8760 hours. HbA1C: No results for input(s): HGBA1C in the last 72 hours. CBG: No results for input(s): GLUCAP in the last 168 hours. Lipid Profile: No results for input(s): CHOL, HDL, LDLCALC, TRIG, CHOLHDL, LDLDIRECT in the last 72 hours. Thyroid Function Tests: No results for input(s): TSH, T4TOTAL, FREET4, T3FREE, THYROIDAB in the last 72 hours. Anemia Panel: No results for input(s): VITAMINB12, FOLATE, FERRITIN, TIBC, IRON, RETICCTPCT in the last 72 hours. Urine analysis:    Component Value Date/Time   COLORURINE YELLOW 06/19/2015 2234   APPEARANCEUR CLEAR 06/19/2015 2234   LABSPEC 1.014 06/19/2015 2234   PHURINE 6.0 06/19/2015 2234   GLUCOSEU NEGATIVE 06/19/2015 2234   HGBUR NEGATIVE 06/19/2015 2234   BILIRUBINUR NEGATIVE 06/19/2015 2234   KETONESUR NEGATIVE 06/19/2015 2234   PROTEINUR NEGATIVE 06/19/2015 2234   NITRITE  NEGATIVE 06/19/2015 2234   LEUKOCYTESUR NEGATIVE 06/19/2015 2234   Sepsis Labs: No results found for this or any previous visit (from the past 240 hour(s)).   Radiological Exams on Admission: DG Chest Portable 1 View  Result Date: 03/19/2019 CLINICAL DATA:  Fever, hypoxia, COVID-19 positive EXAM: PORTABLE CHEST 1 VIEW COMPARISON:  06/20/2015 chest radiograph. FINDINGS: Stable cardiomediastinal silhouette with normal heart size. No pneumothorax. No pleural effusion. Patchy hazy opacity throughout the left lung appears new. IMPRESSION: New patchy hazy opacity throughout the left lung, compatible with pneumonia. Electronically Signed   By: Ilona Sorrel M.D.   On: 03/27/2019 09:40    Chest x-ray: Independently reviewed.  Left lung opacity concerning for pneumonia EKG: Independently reviewed.  Sinus rhythm at 69 bpm with PVC appreciated with significant background artifact. Assessment/Plan Sepsis/ Respiratory failure with hypoxia secondary to pneumonia due to COVID-19: Acute.  Patient presents with complaints of shortness of breath and fever.  Fever elevated up to 103 F prior to arrival.  WBC within normal limits and lactic acid reassuring at 1.9.  COVID-19 positive.  Chest x-ray showing new patchy opacity in the left lung concerning for pneumonia.  Patient currently on a nonrebreather with O2 saturation still hovering in the mid 80s.  Case discussed with PCCM. -Admit to a progressive bed at [facility] -COVID-19 order set utilized -Continuous pulse oximetry with oxygen as needed to maintain O2 saturation greater than 90%. -Follow-up blood cultures -Check inflammatory markers stat and then daily -Check ABG -Albuterol inhaler every 6 hours -Decadron 6 mg IV daily -Remdesivir per pharmacy -Antitussives as needed -Vitamin C and zinc -GI prophylaxis with Pepcid -Consult PCCM if O2 saturations dropped below 80% or patient peers to be in respiratory failure  UTI: Recurrent.  Patient with history of  frequent UTIs.  Urinalysis positive for signs of infection.  No significant previous culture data available. -Follow-up urine culture -Rocephin IV  Acute kidney injury: Patient presents with creatinine elevated up to 1.76 and BUN 45.  Baseline creatinine previously noted to be within normal limits per review of records on care everywhere.  The elevated BUN to creatinine ratio suggest a prerenal cause of symptoms.  Patient was given 500 mL bolus in the ED. -Normal saline IV fluids at 75 mL/h -Recheck kidney function in a.m.  Hypochromic anemia: Acute.  Hemoglobin 10.3 on admission which appears near patient's baseline previously had been 13.4 in 11/2016 on care everywhere.  Patient is on anticoagulation of Eliquis.  -Type and screen -Continue to monitor and transfuse blood products as needed  Elevated troponin: Acute.  Initial troponin elevated at 77. -Continue to trend  Diarrhea: Patient noted to have several liquid stools while in the ED department.  Suspect secondary to Covid. -Insert rectal tube -Check for C. Difficile  Hyponatremia: Acute.  Sodium 133 on admission.  Suspect hypovolemic hyponatremia given that he appears acutely dehydrated. -Continue to monitor  Paroxysmal atrial fibrillation on anticoagulation: Patient appears to be in sinus rhythm at this time. -Continue Eliquis  Hypothyroidism -Continue levothyroxine  History of Wolff-Parkinson-White -Continue to monitor telemetry  AAA: Patient has mid abdominal aorta aneurysm measures up to 3 cm unchanged from previous discharged on 12/06/2018. -Continue outpatient monitoring  Restart home medications once verified . DVT prophylaxis: Eliquis Code Status: Full Family Communication: Friend updated on patient status Disposition Plan: Likely discharge back to [facility] once medically stable Consults called: none  Admission status: inpatient   Norval Morton MD [facility] Pager [phone]   If 7PM-7AM,  please contact night-coverage [email or web address] Password [email or web address]  03/18/2019, 11:09 AM

## 2019-03-03 NOTE — ED Notes (Signed)
Patient covid 19 tested postive  Report to DR.TEGLER AND NURSE BRENDA [facility].RN.

## 2019-03-03 NOTE — ED Triage Notes (Signed)
Pt here via [facility] from [facility] for sob and fever.  Recently moved to the Covid unit PENDING a covid test result.  Sats of 87% with 6L on ems arrival.  Sats increased to 91% on nrb at 15L.  Temp of 103 at facility and given 650 tylenol at 0730.  Pt states he "feels like hell".  Hx of frequent UTI's.

## 2019-03-03 NOTE — ED Notes (Signed)
MD Tamala Julian and [place] aware of pts increasing O2 requirement w/ sats remaining around 82-84%, pt does come up to 89-91%, however quickly drops back down. Pt on NRB. ABG ordered.

## 2019-03-03 NOTE — ED Provider Notes (Signed)
[facility] Provider Note   CSN: [id number] Arrival date & time:        History No chief complaint on file.   Samuel Shaffer is a 86 y.o. male.  The history is provided by the patient, medical records and the EMS personnel. No language interpreter was used.  Shortness of Breath Severity:  Severe Onset quality:  Gradual Timing:  Constant Progression:  Unchanged Chronicity:  New Relieved by:  Oxygen (nonrebreather) Worsened by:  Nothing Ineffective treatments:  None tried Associated symptoms: fever   Associated symptoms: no abdominal pain, no chest pain, no cough, no diaphoresis, no headaches, no sputum production, no vomiting and no wheezing        Past Medical History:  Diagnosis Date  . A-fib ([place])   . Abdominal aortic aneurysm (AAA) ([place])   . Anemia   . Arthritis   . Bladder cancer [facility])    s/p resection  . Closed left acetabular fracture ([place])   . Gout   . History of GI diverticular bleed   . Hyperlipidemia   . Hypertension   . Hypothyroidism   . Stroke ([place])   . Wolff-Parkinson-White (WPW) pattern     Patient Active Problem List   Diagnosis Date Noted  . Essential hypertension 11/19/2018  . Mixed dyslipidemia 11/19/2018  . Paroxysmal atrial fibrillation ([place]) 11/19/2018  . Stroke ([place])   . Hyperlipidemia   . History of GI diverticular bleed   . Bladder cancer ([place])   . Abdominal aortic aneurysm (AAA) ([place])   . Left acetabular fracture, closed, initial encounter 06/21/2015  . A-fib ([place]) 06/19/2015  . Closed left acetabular fracture ([place]) 06/19/2015  . Wolff-Parkinson-White (WPW) pattern 06/19/2015  . Bladder neoplasm 06/18/2014  . History of bladder cancer 06/18/2014  . Hyperlipidemia, unspecified 06/12/2014  . Gout 06/12/2014  . Anemia 06/12/2014  . Abdominal aortic aneurysm without rupture ([place]) 06/12/2014  . Hypothyroidism 04/10/2013  . H/O: CVA (cerebrovascular accident) 04/10/2013  . Rectal bleed  04/10/2013  . Hearing loss, sensorineural, asymmetrical 08/13/2012    Past Surgical History:  Procedure Laterality Date  . CATARACT EXTRACTION    . CHOLECYSTECTOMY    . CYSTOURETHROSCOPY  06/18/2014   W/Fulguration &/Or Resection Bladder Tumor  . KNEE SURGERY    . TONSILLECTOMY         Family History  Problem Relation Age of Onset  . Neurologic Disorder Mother   . Heart disease Father   . Cancer Daughter     Social History   Tobacco Use  . Smoking status: Former Smoker    Quit date: 02/28/1968    Years since quitting: 51.0  . Smokeless tobacco: Never Used  Substance Use Topics  . Alcohol use: Yes    Alcohol/week: 2.0 standard drinks    Types: 2 Glasses of wine per week  . Drug use: Never    Home Medications Prior to Admission medications   Medication Sig Start Date End Date Taking? Authorizing Provider  allopurinol (ZYLOPRIM) 300 MG tablet Take 300 mg by mouth daily. 05/31/15   [provider]  cholecalciferol (DELTA D3) 10 MCG (400 UNIT) TABS tablet Take by mouth.    [provider]  diltiazem (CARDIZEM CD) 240 MG 24 hr capsule Take by mouth.    [provider]  ELIQUIS 2.5 MG TABS tablet Take 2.5 mg by mouth 2 (two) times daily.  05/27/15   [provider]  ferrous sulfate 325 (65 FE) MG EC tablet Take 325  mg by mouth once.    [provider]  FOSAMAX 70 MG tablet  08/01/18   [provider]  furosemide (LASIX) 40 MG tablet Take 40 mg by mouth.    [provider]  metoprolol tartrate (LOPRESSOR) 25 MG tablet  08/17/18   [provider]  Multiple Vitamins-Minerals (PRESERVISION AREDS 2 PO) Take by mouth.    [provider]  oxyCODONE (OXY IR/ROXICODONE) 5 MG immediate release tablet Take 5 mg by mouth every 8 (eight) hours.    [provider]  potassium chloride (K-DUR) 10 MEQ tablet Take 10 mEq by mouth daily.    [provider]  sulfamethoxazole-trimethoprim (BACTRIM DS)  800-160 MG tablet Take 1 tablet by mouth 2 (two) times daily.    [provider]  SYNTHROID 150 MCG tablet  09/01/18   [provider]  Vitamin D, Cholecalciferol, 400 units CAPS Take 400 Units by mouth daily.    [provider]    Allergies    Patient has no known allergies.  Review of Systems   Review of Systems  Constitutional: Positive for chills and fever. Negative for diaphoresis and fatigue.  HENT: Negative for congestion.   Respiratory: Positive for shortness of breath. Negative for cough, sputum production, chest tightness and wheezing.   Cardiovascular: Negative for chest pain and palpitations.  Gastrointestinal: Negative for abdominal pain, constipation, diarrhea, nausea and vomiting.  Genitourinary: Negative for flank pain.  Musculoskeletal: Negative for back pain.  Neurological: Negative for dizziness and headaches.  Psychiatric/Behavioral: Negative for agitation.  All other systems reviewed and are negative.   Physical Exam Updated Vital Signs BP (!) 100/51   Pulse 69   Temp 98.4 F (36.9 C) (Oral)   Resp (!) 22   Ht 5\' 10"  (1.778 m)   Wt 73.5 kg   SpO2 96%   BMI 23.25 kg/m   Physical Exam Vitals and nursing note reviewed.  Constitutional:      General: He is in acute distress.     Appearance: He is well-developed. He is ill-appearing. He is not toxic-appearing or diaphoretic.  HENT:     Head: Normocephalic and atraumatic.     Nose: No congestion.     Mouth/Throat:     Mouth: Mucous membranes are moist.     Pharynx: No oropharyngeal exudate or posterior oropharyngeal erythema.  Eyes:     Conjunctiva/sclera: Conjunctivae normal.     Pupils: Pupils are equal, round, and reactive to light.  Cardiovascular:     Rate and Rhythm: Regular rhythm. Tachycardia present.     Heart sounds: No murmur.  Pulmonary:     Effort: Respiratory distress present.     Breath sounds: No stridor. Rhonchi present. No wheezing or rales.  Chest:      Chest wall: No tenderness.  Abdominal:     General: Abdomen is flat.     Palpations: Abdomen is soft.     Tenderness: There is no abdominal tenderness. There is no right CVA tenderness or left CVA tenderness.  Musculoskeletal:        General: No tenderness.     Cervical back: Neck supple. No tenderness.     Right lower leg: No edema.     Left lower leg: No edema.  Skin:    General: Skin is warm and dry.     Capillary Refill: Capillary refill takes less than 2 seconds.     Findings: No erythema.  Neurological:     General: No focal deficit  present.     Mental Status: He is alert.  Psychiatric:        Mood and Affect: Mood normal.     ED Results / Procedures / Treatments   Labs (all labs ordered are listed, but only abnormal results are displayed) Labs Reviewed  CBC WITH DIFFERENTIAL/PLATELET - Abnormal; Notable for the following components:      Result Value   RBC 4.07 (*)    Hemoglobin 10.3 (*)    HCT 32.7 (*)    MCH 25.3 (*)    RDW 18.0 (*)    All other components within normal limits  COMPREHENSIVE METABOLIC PANEL - Abnormal; Notable for the following components:   Sodium 133 (*)    CO2 21 (*)    Glucose, Bld 100 (*)    BUN 45 (*)    Creatinine, Ser 1.76 (*)    Calcium 7.3 (*)    Total Protein 5.6 (*)    Albumin 2.2 (*)    AST 115 (*)    GFR calc non Af Amer 33 (*)    GFR calc Af Amer 39 (*)    All other components within normal limits  LACTIC ACID, PLASMA - Abnormal; Notable for the following components:   Lactic Acid, Venous 2.1 (*)    All other components within normal limits  LIPASE, BLOOD - Abnormal; Notable for the following components:   Lipase 59 (*)    All other components within normal limits  BRAIN NATRIURETIC PEPTIDE - Abnormal; Notable for the following components:   B Natriuretic Peptide 165.5 (*)    All other components within normal limits  URINALYSIS, ROUTINE W REFLEX MICROSCOPIC - Abnormal; Notable for the following components:   Color,  Urine GREEN (*)    APPearance TURBID (*)    Hgb urine dipstick LARGE (*)    Ketones, ur 15 (*)    Protein, ur 100 (*)    Leukocytes,Ua MODERATE (*)    All other components within normal limits  BRAIN NATRIURETIC PEPTIDE - Abnormal; Notable for the following components:   B Natriuretic Peptide 230.4 (*)    All other components within normal limits  C-REACTIVE PROTEIN - Abnormal; Notable for the following components:   CRP 12.9 (*)    All other components within normal limits  D-DIMER, QUANTITATIVE (NOT AT [facility]) - Abnormal; Notable for the following components:   D-Dimer, Quant 3.51 (*)    All other components within normal limits  FIBRINOGEN - Abnormal; Notable for the following components:   Fibrinogen 515 (*)    All other components within normal limits  LACTATE DEHYDROGENASE - Abnormal; Notable for the following components:   LDH 461 (*)    All other components within normal limits  URINALYSIS, MICROSCOPIC (REFLEX) - Abnormal; Notable for the following components:   Bacteria, UA MANY (*)    All other components within normal limits  TSH - Abnormal; Notable for the following components:   TSH 16.607 (*)    All other components within normal limits  POC SARS CORONAVIRUS 2 AG -  ED - Abnormal; Notable for the following components:   SARS Coronavirus 2 Ag POSITIVE (*)    All other components within normal limits  POCT I-STAT 7, (LYTES, BLD GAS, ICA,H+H) - Abnormal; Notable for the following components:   pH, Arterial 7.464 (*)    pCO2 arterial 26.8 (*)    pO2, Arterial 42.0 (*)    Bicarbonate 19.3 (*)    TCO2 20 (*)  Acid-base deficit 3.0 (*)    Calcium, Ion 1.07 (*)    HCT 33.0 (*)    Hemoglobin 11.2 (*)    All other components within normal limits  TROPONIN I (HIGH SENSITIVITY) - Abnormal; Notable for the following components:   Troponin I (High Sensitivity) 77 (*)    All other components within normal limits  TROPONIN I (HIGH SENSITIVITY) - Abnormal; Notable for the  following components:   Troponin I (High Sensitivity) 71 (*)    All other components within normal limits  CULTURE, BLOOD (ROUTINE X 2)  CULTURE, BLOOD (ROUTINE X 2)  URINE CULTURE  C DIFFICILE QUICK SCREEN W PCR REFLEX  LACTIC ACID, PLASMA  FERRITIN  HEPATITIS B SURFACE ANTIGEN  PROCALCITONIN  BLOOD GAS, ARTERIAL  ABO/RH  TYPE AND SCREEN    EKG EKG Interpretation  Date/Time:  Monday March 03 2019 12:42:47 EST Ventricular Rate:  69 PR Interval:    QRS Duration: 144 QT Interval:  450 QTC Calculation: 483 R Axis:   24 Text Interpretation: Sinus rhythm Ventricular premature complex Nonspecific intraventricular conduction delay When compared to prior, more artifact. No STEMI Confirmed by Antony Blackbird [id number]) on 03/07/2019 2:17:50 PM   Radiology DG Chest Portable 1 View  Result Date: 03/08/2019 CLINICAL DATA:  Fever, hypoxia, COVID-19 positive EXAM: PORTABLE CHEST 1 VIEW COMPARISON:  06/20/2015 chest radiograph. FINDINGS: Stable cardiomediastinal silhouette with normal heart size. No pneumothorax. No pleural effusion. Patchy hazy opacity throughout the left lung appears new. IMPRESSION: New patchy hazy opacity throughout the left lung, compatible with pneumonia. Electronically Signed   By: Ilona Sorrel M.D.   On: 03/16/2019 09:40    Procedures Procedures (including critical care time)  CRITICAL CARE Performed by: Gwenyth Allegra Tegeler Total critical care time: 35 minutes Critical care time was exclusive of separately billable procedures and treating other patients. COVID-19 and hypoxic respiratory failure requiring nonrebreather Critical care was necessary to treat or prevent imminent or life-threatening deterioration. Critical care was time spent personally by me on the following activities: development of treatment plan with patient and/or surrogate as well as nursing, discussions with consultants, evaluation of patient's response to treatment, examination of patient,  obtaining history from patient or surrogate, ordering and performing treatments and interventions, ordering and review of laboratory studies, ordering and review of radiographic studies, pulse oximetry and re-evaluation of patient's condition.   Medications Ordered in ED Medications - No data to display  ED Course  I have reviewed the triage vital signs and the nursing notes.  Pertinent labs & imaging results that were available during my care of the patient were reviewed by me and considered in my medical decision making (see chart for details).    MDM Rules/Calculators/A&P                      ZAIN LAWRY is a 86 y.o. male with a past medical history significant for atrial fibrillation on Eliquis therapy, hypothyroidism, hypertension, hyperlipidemia, AAA, WPW, prior stroke, prior GI bleed, and gout who presents from nursing facility for fever, shortness of breath, and hypoxia.  Patient brought in by EMS on a nonrebreather with oxygen saturations in the upper 80s.  Therefore the patient is found to be febrile today and hypoxic at the facility.  He denies any symptoms other than some mild shortness of breath.  He denies any cough, abdominal pain, or chest pain.  Denies any new leg pain or leg swelling.  Denies recent trauma.  According to  EMS, patient was "in the Covid wing of the facility and currently has a pending test".  Patient otherwise has no complaints.  EMS says that the [place] is currently is mental status baseline with no dementia and he is currently "with it".  On exam, patient does have rhonchi in his lungs and his chest and back are nontender.  Abdomen is nontender.  He is hypoxic and on 15 L nonrebreather his sats are around 89 to 91%.  Patient's legs are nontender nonedematous.  Patient is good pulses in upper extremities.  He is now resting comfortably on the nonrebreather.  Clinically I am concerned the patient may have COVID-19 infection.  Also concerned about pneumonia  given the fever.  They reported patient had an axillary temperature of 102.8 in route although he received Tylenol just prior to come to the emergency department.  Will get rapid Covid test given his nonrebreather need.  If the 15-minute test is negative, anticipate the 2-hour test in the event that he may need intubation as he is full code.  Anticipate admission for hypoxia and fever when work-up is completed.  10:04 AM Covid has returned positive.  Suspect Covid is the primary source of symptoms.  Due to soft blood pressure, will give a small amount of fluids so as not to flood patient.  As he is Covid positive, will hold on antibiotics at this time until I speak with medicine team.  Once labs return, will be admitted for hypoxia with Covid.  Patient admitted for further management of hypoxia requiring nonrebreather.  Final Clinical Impression(s) / ED Diagnoses Final diagnoses:  Hypoxia  SOB (shortness of breath)  COVID-19    Rx / DC Orders ED Discharge Orders    None     Clinical Impression: 1. Hypoxia   2. SOB (shortness of breath)   3. COVID-19     Disposition: Admit  This note was prepared with assistance of Dragon voice recognition software. Occasional wrong-word or sound-a-like substitutions may have occurred due to the inherent limitations of voice recognition software.     Tegeler, Gwenyth Allegra, MD 03/02/2019 [phone]

## 2019-03-03 NOTE — ED Notes (Signed)
Self prone attempted per request from MD Tamala Julian, pt unable to prone, w/ assistance from 3 staff. Pt is contracted, unable to tolerate.

## 2019-03-04 ENCOUNTER — Encounter (HOSPITAL_COMMUNITY): Payer: Self-pay

## 2019-03-04 DIAGNOSIS — J9601 Acute respiratory failure with hypoxia: Secondary | ICD-10-CM

## 2019-03-04 DIAGNOSIS — L8990 Pressure ulcer of unspecified site, unspecified stage: Secondary | ICD-10-CM | POA: Insufficient documentation

## 2019-03-04 LAB — CBC WITH DIFFERENTIAL/PLATELET
Abs Immature Granulocytes: 0.09 10*3/uL — ABNORMAL HIGH (ref 0.00–0.07)
Basophils Absolute: 0 10*3/uL (ref 0.0–0.1)
Basophils Relative: 0 %
Eosinophils Absolute: 0 10*3/uL (ref 0.0–0.5)
Eosinophils Relative: 0 %
HCT: 33.3 % — ABNORMAL LOW (ref 39.0–52.0)
Hemoglobin: 10.6 g/dL — ABNORMAL LOW (ref 13.0–17.0)
Immature Granulocytes: 1 %
Lymphocytes Relative: 9 %
Lymphs Abs: 0.8 10*3/uL (ref 0.7–4.0)
MCH: 25.7 pg — ABNORMAL LOW (ref 26.0–34.0)
MCHC: 31.8 g/dL (ref 30.0–36.0)
MCV: 80.6 fL (ref 80.0–100.0)
Monocytes Absolute: 0.4 10*3/uL (ref 0.1–1.0)
Monocytes Relative: 4 %
Neutro Abs: 8.3 10*3/uL — ABNORMAL HIGH (ref 1.7–7.7)
Neutrophils Relative %: 86 %
Platelets: 243 10*3/uL (ref 150–400)
RBC: 4.13 MIL/uL — ABNORMAL LOW (ref 4.22–5.81)
RDW: 18.4 % — ABNORMAL HIGH (ref 11.5–15.5)
WBC: 9.7 10*3/uL (ref 4.0–10.5)
nRBC: 0 % (ref 0.0–0.2)

## 2019-03-04 LAB — C-REACTIVE PROTEIN: CRP: 14.9 mg/dL — ABNORMAL HIGH (ref <1.0)

## 2019-03-04 LAB — COMPREHENSIVE METABOLIC PANEL
ALT: 40 U/L (ref 0–44)
AST: 128 U/L — ABNORMAL HIGH (ref 15–41)
Albumin: 2.2 g/dL — ABNORMAL LOW (ref 3.5–5.0)
Alkaline Phosphatase: 84 U/L (ref 38–126)
Anion gap: 13 (ref 5–15)
BUN: 47 mg/dL — ABNORMAL HIGH (ref 8–23)
CO2: 18 mmol/L — ABNORMAL LOW (ref 22–32)
Calcium: 7 mg/dL — ABNORMAL LOW (ref 8.9–10.3)
Chloride: 108 mmol/L (ref 98–111)
Creatinine, Ser: 1.63 mg/dL — ABNORMAL HIGH (ref 0.61–1.24)
GFR calc Af Amer: 42 mL/min — ABNORMAL LOW (ref >60)
GFR calc non Af Amer: 37 mL/min — ABNORMAL LOW (ref >60)
Glucose, Bld: 115 mg/dL — ABNORMAL HIGH (ref 70–99)
Potassium: 3.8 mmol/L (ref 3.5–5.1)
Sodium: 139 mmol/L (ref 135–145)
Total Bilirubin: 0.6 mg/dL (ref 0.3–1.2)
Total Protein: 5.4 g/dL — ABNORMAL LOW (ref 6.5–8.1)

## 2019-03-04 LAB — FERRITIN: Ferritin: 287 ng/mL (ref 24–336)

## 2019-03-04 LAB — D-DIMER, QUANTITATIVE: D-Dimer, Quant: 2.85 ug/mL-FEU — ABNORMAL HIGH (ref 0.00–0.50)

## 2019-03-04 LAB — MAGNESIUM: Magnesium: 1.7 mg/dL (ref 1.7–2.4)

## 2019-03-04 LAB — PHOSPHORUS: Phosphorus: 3.7 mg/dL (ref 2.5–4.6)

## 2019-03-04 MED ORDER — SODIUM CHLORIDE 0.9 % IV BOLUS: 500.0000 mL | Freq: Once | INTRAVENOUS | Status: DC

## 2019-03-04 MED ORDER — CHLORHEXIDINE GLUCONATE CLOTH 2 % EX PADS
6.0000 | MEDICATED_PAD | Freq: Every day | CUTANEOUS | Status: DC
  Administered 2019-03-04: 6 via TOPICAL

## 2019-03-04 MED ORDER — DILTIAZEM HCL-DEXTROSE 125-5 MG/125ML-% IV SOLN (PREMIX)
5.0000 mg/h | INTRAVENOUS | Status: DC
  Administered 2019-03-04: 10 mg/h via INTRAVENOUS
  Administered 2019-03-04: 5 mg/h via INTRAVENOUS
  Filled 2019-03-04: qty 125

## 2019-03-04 MED ORDER — ORAL CARE MOUTH RINSE
15.0000 mL | Freq: Two times a day (BID) | OROMUCOSAL | Status: DC
  Administered 2019-03-04 – 2019-03-05 (×2): 15 mL via OROMUCOSAL

## 2019-03-04 MED ORDER — HALOPERIDOL LACTATE 5 MG/ML IJ SOLN: 1.0000 mg | Freq: Four times a day (QID) | INTRAMUSCULAR | Status: DC | PRN

## 2019-03-04 MED ORDER — SODIUM CHLORIDE 0.9 % IV BOLUS
500.0000 mL | Freq: Once | INTRAVENOUS | Status: AC
  Administered 2019-03-04: 500 mL via INTRAVENOUS

## 2019-03-04 MED ORDER — MAGNESIUM SULFATE 2 GM/50ML IV SOLN
2.0000 g | Freq: Once | INTRAVENOUS | Status: AC
  Administered 2019-03-04: 2 g via INTRAVENOUS
  Filled 2019-03-04: qty 50

## 2019-03-04 MED ORDER — HALOPERIDOL LACTATE 5 MG/ML IJ SOLN: 2.0000 mg | Freq: Four times a day (QID) | INTRAMUSCULAR | Status: DC | PRN

## 2019-03-04 MED ORDER — AMIODARONE LOAD VIA INFUSION
150.0000 mg | Freq: Once | INTRAVENOUS | Status: AC
  Administered 2019-03-04: 150 mg via INTRAVENOUS
  Filled 2019-03-04: qty 83.34

## 2019-03-04 MED ORDER — HALOPERIDOL LACTATE 5 MG/ML IJ SOLN
1.0000 mg | INTRAMUSCULAR | Status: DC | PRN
  Administered 2019-03-04: 2 mg via INTRAVENOUS
  Filled 2019-03-04 (×2): qty 1

## 2019-03-04 MED ORDER — METOPROLOL TARTRATE 5 MG/5ML IV SOLN
5.0000 mg | Freq: Four times a day (QID) | INTRAVENOUS | Status: DC
  Administered 2019-03-04 – 2019-03-05 (×3): 5 mg via INTRAVENOUS
  Filled 2019-03-04 (×3): qty 5

## 2019-03-04 MED ORDER — LORAZEPAM 2 MG/ML IJ SOLN
0.5000 mg | Freq: Once | INTRAMUSCULAR | Status: AC
  Administered 2019-03-04: 0.5 mg via INTRAVENOUS
  Filled 2019-03-04: qty 1

## 2019-03-04 MED ORDER — ENOXAPARIN SODIUM 80 MG/0.8ML ~~LOC~~ SOLN
1.0000 mg/kg | Freq: Two times a day (BID) | SUBCUTANEOUS | Status: DC
  Administered 2019-03-04 (×2): 75 mg via SUBCUTANEOUS
  Filled 2019-03-04 (×3): qty 0.8

## 2019-03-04 MED ORDER — HALOPERIDOL LACTATE 5 MG/ML IJ SOLN
5.0000 mg | INTRAMUSCULAR | Status: DC | PRN
  Administered 2019-03-04 – 2019-03-05 (×3): 5 mg via INTRAVENOUS
  Filled 2019-03-04 (×2): qty 1

## 2019-03-04 MED ORDER — INFLUENZA VAC A&B SA ADJ QUAD 0.5 ML IM PRSY: 0.5000 mL | PREFILLED_SYRINGE | INTRAMUSCULAR | Status: DC

## 2019-03-04 MED ORDER — PANTOPRAZOLE SODIUM 40 MG IV SOLR
40.0000 mg | INTRAVENOUS | Status: DC
  Administered 2019-03-04 – 2019-03-05 (×2): 40 mg via INTRAVENOUS
  Filled 2019-03-04 (×2): qty 40

## 2019-03-04 MED ORDER — AMIODARONE HCL IN DEXTROSE 360-4.14 MG/200ML-% IV SOLN
60.0000 mg/h | INTRAVENOUS | Status: DC
  Administered 2019-03-04: 60 mg/h via INTRAVENOUS
  Filled 2019-03-04: qty 200

## 2019-03-04 MED ORDER — AMIODARONE HCL IN DEXTROSE 360-4.14 MG/200ML-% IV SOLN
30.0000 mg/h | INTRAVENOUS | Status: DC
  Administered 2019-03-04 – 2019-03-05 (×2): 30 mg/h via INTRAVENOUS
  Filled 2019-03-04 (×3): qty 200

## 2019-03-04 MED ORDER — MORPHINE SULFATE (PF) 2 MG/ML IV SOLN
2.0000 mg | INTRAVENOUS | Status: DC | PRN
  Administered 2019-03-04 (×3): 2 mg via INTRAVENOUS
  Administered 2019-03-05 (×3): 4 mg via INTRAVENOUS
  Administered 2019-03-05: 2 mg via INTRAVENOUS
  Filled 2019-03-04 (×3): qty 1
  Filled 2019-03-04 (×2): qty 2
  Filled 2019-03-04: qty 1
  Filled 2019-03-04: qty 2
  Filled 2019-03-04: qty 1
  Filled 2019-03-04: qty 2

## 2019-03-04 MED ORDER — DILTIAZEM HCL 25 MG/5ML IV SOLN
10.0000 mg | Freq: Once | INTRAVENOUS | Status: AC
  Administered 2019-03-04: 10 mg via INTRAVENOUS
  Filled 2019-03-04: qty 5

## 2019-03-04 NOTE — Progress Notes (Signed)
Pt transported to 207-4 with this RN.

## 2019-03-04 NOTE — Progress Notes (Signed)
MD notified:  Pt Cardizem now maxed at 15mg /hr.  Order for HR 65-105, presently 130s.  Of note also, sats 85% on NRB & HHFNC @ 100%, 30L. MD acknowledged msg [phone]  Pt was made DNR @ 1109 based on 2 doctor assessments.   Pt became combative and refused to wear O2 mask or cannula.   Requested medication for restlessness and agitation.   Contacted Georgiana Shore, ex-wife, to let her know code status.  She called son, Meguel Helming who in turn called me with concerns.  Alerted MD that son desired a call [phone].   Advised MD pt had his O2 off after fighting [place] for almost an hour and a half with sats in the 20's, HR still 70's. Patient will not allow O2 mask or cannula though with sats in the teens.  Morphine was administered and cannula was replaced. MD spoke with son:  'aware not competent to make his own decisions, ...placing him on the vent is futile care and is not appropriate for him (as confirmed by 2 MD assessment)' Through remainder of shift, pt needed morphine for restlessness and to inconsistently comply to wear O2 mask and cannula despite low sats. Pt arousable and at times able to communicate coherently.

## 2019-03-04 NOTE — Progress Notes (Signed)
Received call from RN in icu.  Pt struggling and very uncomfortable with breathing with sats in the 50s on maximum oxygen support.  RN hestitant to use sedatives in this setting.  After reviewing chart, I fully agree with 2 MDs today making patient DNR, with family wanting maximum treatment continued and is not "comfort care".  Have placed order for higher haldol dose (given around 7pm and helped him for about 3 hours) and will provide morphine if this does not work in order to help make patient more comfortable during the dying process.  The primary goal in this clinical situation should be to make sure he is comfortable accepting the risks and benefits sedatives provide.  I expect he will expire tonight.

## 2019-03-04 NOTE — Progress Notes (Signed)
   I spoke with the patient's ex-wife, who identified herself as his HCPOA, earlier this AM and she informed me that the patient had previously reported to her that he would want "everything done" if he were to become critically ill. I explained the gravity of his situation / the critical nature of his current illness, as well as the complex nature of his care given multiple acute severe issues going on at the same time.   Upon visiting the patient, he is found to be actively fighting against the staff, refusing to wear even HFNC. He states repeatedly he simply wants to be left alone. On 3 different occasions he indicates to me and 2 RNs present at his bedside that he does NOT wish to be placed on the "life support machine / ventilator." He is consistent in this communication, though it is difficult to get him to answer any other questions consistently.   As his attending MD, I do not feel that intubating him / placing him on "life support" is appropriate. I feel it represents futile care, and care he at present is indicating he does not want.   Given that he is acutely severely ill, and given that he may not be fully understanding the situation, I have asked for a second opinion from the PCCM team. Dr. Lamonte Sakai has reviewed the case (note to follow) and agrees that intubation/CPR/defib are not appropriate for this gentleman.   Given the agreement of 2 MDs, as well as the attending nursing staff, I am making [name] DNR/NO CODE BLUE. We will continue aggressive care short of this, and otherwise do everything within our power to help him improve and survive this critical illness.   Cherene Altes, MD [facility] Office  [phone]  03/04/2019, 11:17 AM

## 2019-03-04 NOTE — Consult Note (Signed)
NAME:  Samuel Shaffer, MRN:  [id number], DOB:  [birth year], LOS: 1 ADMISSION DATE:  03/09/2019, CONSULTATION DATE:  03/04/2019 REFERRING MD:  Dr Thereasa Solo, CHIEF COMPLAINT:  resp failure    History of present illness   86 yo m, PMH as below. Admitted with hypoxic respiratory failure, B pulmonary infiltrates due to COVID PNA. He has developed progressive hypoxemia, escalating o2 needs, now some hypoxic encephalopathy and delirium. Decadron given, may have contributed to delirium also. UA suggested possible UTI as well, treated with ceftriaxone.   I was asked to see him today because he is having progressive hypoxic resp failure, is taking off his O2 due to his delirium.   Past Medical History   has a past medical history of A-fib ([place]), Abdominal aortic aneurysm (AAA) ([place]), Anemia, Arthritis, Bladder cancer ([place]), Closed left acetabular fracture ([place]), Gout, History of GI diverticular bleed, Hyperlipidemia, Hypertension, Hypothyroidism, Pressure ulcer, Stroke ([place]), and Wolff-Parkinson-White (WPW) pattern.   Interim history/subjective:  He is a bit uncomfortable, is pulling tubing and O2 off Pushes my hand away when I examine him Unable to converse with me about GOC  Objective   Blood pressure 105/72, pulse (!) 139, temperature 98.9 F (37.2 C), temperature source Axillary, resp. rate (!) 23, height 5\' 10"  (1.778 m), weight 73.5 kg, SpO2 (!) 88 %.    FiO2 (%):  [100 %] 100 %   Intake/Output Summary (Last 24 hours) at 03/04/2019 1154 Last data filed at 03/04/2019 0600 Gross per 24 hour  Intake 890 ml  Output 850 ml  Net 40 ml   Filed Weights   03/02/2019 0905  Weight: 73.5 kg    Examination: General: ill appearing elderly man, in moderate resp distress HENT: OP clear, edentulous Lungs: distant, coarse, no wheeze Cardiovascular: regular, distant, no M Abdomen: soft, non distended, no BS heard Extremities: no edema, cool Neuro: awake, confused and pulling at tubing, will converse  and answer some questions. No insight into his disease or current clinical condition. Fairly strong considering age and clinical condition  Resolved Hospital Problem list     Assessment & Plan:  Acute hypoxic respiratory failure due to COVID PNA, complicated by hypoxic deliriumj and encephalopathy.   I have examined, evaluated the patient independently. Also discussed the case with Dr Thereasa Solo, reviewed his conversations with the patient's ex-wife.   I do not believe Samuel Shaffer can or would survive mechanical ventilation or CPR if he continues to have progression of his resp failure. For that reason, I would not offer these things. We can and should support with meds and O2 as he will allow [place] to. I fully agree with DNR/I status. Discussed with his RN at bedside  With regard to his delirium - may benefit from low dose haldol depending on the course.    Labs   CBC: Recent Labs  Lab 02/28/2019 0912 03/05/2019 1303 03/27/2019 2347 03/04/19 0530  WBC 7.6  --   --  9.7  NEUTROABS 6.5  --   --  8.3*  HGB 10.3* 11.2* 12.6* 10.6*  HCT 32.7* 33.0* 37.0* 33.3*  MCV 80.3  --   --  80.6  PLT 251  --   --  0000000    Basic Metabolic Panel: Recent Labs  Lab 03/12/2019 0912 03/05/2019 1303 03/19/2019 2347 03/04/19 0530  NA 133* 136 139 139  K 4.3 3.9 4.2 3.8  CL 101  --   --  108  CO2 21*  --   --  18*  GLUCOSE 100*  --   --  115*  BUN 45*  --   --  47*  CREATININE 1.76*  --   --  1.63*  CALCIUM 7.3*  --   --  7.0*  MG  --   --   --  1.7  PHOS  --   --   --  3.7   GFR: Estimated Creatinine Clearance: 31.1 mL/min (A) (by C-G formula based on SCr of 1.63 mg/dL (H)). Recent Labs  Lab 02/28/2019 0912 03/13/2019 1212 03/04/19 0530  PROCALCITON  --  0.45  --   WBC 7.6  --  9.7  LATICACIDVEN 1.9 2.1*  --     Liver Function Tests: Recent Labs  Lab 03/05/2019 0912 03/04/19 0530  AST 115* 128*  ALT 33 40  ALKPHOS 77 84  BILITOT 1.2 0.6  PROT 5.6* 5.4*  ALBUMIN 2.2* 2.2*   Recent Labs  Lab  03/01/2019 0912  LIPASE 59*   No results for input(s): AMMONIA in the last 168 hours.  ABG    Component Value Date/Time   PHART 7.436 03/27/2019 2347   PCO2ART 26.5 (L) 03/18/2019 2347   PO2ART 44.0 (L) 03/30/2019 2347   HCO3 17.5 (L) 03/14/2019 2347   TCO2 18 (L) 03/19/2019 2347   ACIDBASEDEF 5.0 (H) 03/22/2019 2347   O2SAT 78.0 03/19/2019 2347     Coagulation Profile: No results for input(s): INR, PROTIME in the last 168 hours.  Cardiac Enzymes: No results for input(s): CKTOTAL, CKMB, CKMBINDEX, TROPONINI in the last 168 hours.  HbA1C: No results found for: HGBA1C  CBG: No results for input(s): GLUCAP in the last 168 hours.  Review of Systems:   Denies pain  Past Medical History  He,  has a past medical history of A-fib ([place]), Abdominal aortic aneurysm (AAA) ([place]), Anemia, Arthritis, Bladder cancer ([place]), Closed left acetabular fracture ([place]), Gout, History of GI diverticular bleed, Hyperlipidemia, Hypertension, Hypothyroidism, Pressure ulcer, Stroke ([place]), and Wolff-Parkinson-White (WPW) pattern.   Surgical History    Past Surgical History:  Procedure Laterality Date  . CATARACT EXTRACTION    . CHOLECYSTECTOMY    . CYSTOURETHROSCOPY  06/18/2014   W/Fulguration &/Or Resection Bladder Tumor  . KNEE SURGERY    . TONSILLECTOMY       Social History   reports that he quit smoking about 51 years ago. He has never used smokeless tobacco. He reports current alcohol use of about 2.0 standard drinks of alcohol per week. He reports that he does not use drugs.   Family History   His family history includes Cancer in his daughter; Heart disease in his father; Neurologic Disorder in his mother.   Allergies No Known Allergies   Home Medications  Prior to Admission medications   Medication Sig Start Date End Date Taking? Authorizing Provider  acetaminophen (TYLENOL) 325 MG tablet Take 650 mg by mouth every 4 (four) hours as needed for fever.   Yes [provider]    allopurinol (ZYLOPRIM) 300 MG tablet Take 300 mg by mouth daily. 05/31/15  Yes [provider]  Amino Acids-Protein Hydrolys (FEEDING SUPPLEMENT, PRO-STAT SUGAR FREE 64,) LIQD Take 30 mLs by mouth daily.   Yes [provider]  diltiazem (CARDIZEM CD) 240 MG 24 hr capsule Take 240 mg by mouth daily.    Yes [provider]  ELIQUIS 2.5 MG TABS tablet Take 2.5 mg by mouth 2 (two) times daily.  05/27/15  Yes [provider]  ferrous sulfate 325 (65 FE)  MG EC tablet Take 325 mg by mouth daily.    Yes [provider]  FOSAMAX 70 MG tablet Take 70 mg by mouth once a week. Tuesday 08/01/18  Yes [provider]  furosemide (LASIX) 40 MG tablet Take 40 mg by mouth daily.    Yes [provider]  metoprolol tartrate (LOPRESSOR) 25 MG tablet Take 25 mg by mouth 2 (two) times daily.  08/17/18  Yes [provider]  Multiple Vitamins-Minerals (PRESERVISION AREDS 2 PO) Take 1 capsule by mouth daily.    Yes [provider]  potassium chloride (K-DUR) 10 MEQ tablet Take 10 mEq by mouth daily.   Yes [provider]  SYNTHROID 150 MCG tablet Take 150 mcg by mouth daily before breakfast.  09/01/18  Yes [provider]  Vitamin D, Cholecalciferol, 50 MCG (2000 UT) CAPS Take 2,000 Units by mouth daily.    Yes [provider]     Critical care time: 40 min     Baltazar Apo, MD, PhD 03/04/2019, 11:56 AM [facility] [phone] or if no answer [phone]

## 2019-03-04 NOTE — Progress Notes (Signed)
ANTICOAGULATION CONSULT NOTE - Initial Consult  Pharmacy Consult for Lovenox  Indication: atrial fibrillation  No Known Allergies  Patient Measurements: Height: 5\' 10"  (177.8 cm) Weight: 162 lb 0.6 oz (73.5 kg) IBW/kg (Calculated) : 73  Vital Signs: Temp: 98.9 F (37.2 C) (01/05 0800) Temp Source: Axillary (01/05 0800) BP: 121/53 (01/05 0815) Pulse Rate: 122 (01/05 0815)  Labs: Recent Labs    03/14/2019 0912 03/30/2019 1212 03/13/2019 1303 03/16/2019 2347 03/04/19 0530  HGB 10.3*  --  11.2* 12.6* 10.6*  HCT 32.7*  --  33.0* 37.0* 33.3*  PLT 251  --   --   --  243  CREATININE 1.76*  --   --   --  1.63*  TROPONINIHS 77* 71*  --   --   --     Estimated Creatinine Clearance: 31.1 mL/min (A) (by C-G formula based on SCr of 1.63 mg/dL (H)).   Medical History: Past Medical History:  Diagnosis Date  . A-fib ([place])   . Abdominal aortic aneurysm (AAA) ([place])   . Anemia   . Arthritis   . Bladder cancer [facility])    s/p resection  . Closed left acetabular fracture ([place])   . Gout   . History of GI diverticular bleed   . Hyperlipidemia   . Hypertension   . Hypothyroidism   . Pressure ulcer   . Stroke ([place])   . Wolff-Parkinson-White (WPW) pattern     Medications:  Medications Prior to Admission  Medication Sig Dispense Refill Last Dose  . acetaminophen (TYLENOL) 325 MG tablet Take 650 mg by mouth every 4 (four) hours as needed for fever.   03/20/2019 at Unknown time  . allopurinol (ZYLOPRIM) 300 MG tablet Take 300 mg by mouth daily.   03/25/2019 at Unknown time  . Amino Acids-Protein Hydrolys (FEEDING SUPPLEMENT, PRO-STAT SUGAR FREE 64,) LIQD Take 30 mLs by mouth daily.   03/28/2019 at Unknown time  . diltiazem (CARDIZEM CD) 240 MG 24 hr capsule Take 240 mg by mouth daily.    03/02/2019 at Unknown time  . ELIQUIS 2.5 MG TABS tablet Take 2.5 mg by mouth 2 (two) times daily.    03/02/2019 at 0800  . ferrous sulfate 325 (65 FE) MG EC tablet Take 325 mg by mouth daily.    03/02/2019 at Unknown  time  . FOSAMAX 70 MG tablet Take 70 mg by mouth once a week. Tuesday   02/25/2019  . furosemide (LASIX) 40 MG tablet Take 40 mg by mouth daily.    03/20/2019 at Unknown time  . metoprolol tartrate (LOPRESSOR) 25 MG tablet Take 25 mg by mouth 2 (two) times daily.    03/15/2019 at 0800  . Multiple Vitamins-Minerals (PRESERVISION AREDS 2 PO) Take 1 capsule by mouth daily.    03/17/2019 at Unknown time  . potassium chloride (K-DUR) 10 MEQ tablet Take 10 mEq by mouth daily.   03/04/2019 at Unknown time  . SYNTHROID 150 MCG tablet Take 150 mcg by mouth daily before breakfast.    03/19/2019 at 0600  . Vitamin D, Cholecalciferol, 50 MCG (2000 UT) CAPS Take 2,000 Units by mouth daily.    03/29/2019 at Unknown time    Assessment: 51 YOM with h/o of AFib on apixaban at home admitted with COVID-19 pneumonia. Pharmacy consulted to transition patient from apixaban to SQ Lovenox. Of note, patient's SCr is elevated and CrCl is ~ 30 mL/min. H/H trending down. Plt wnl   Last dose of apixaban was yesterday afternoon   Goal  of Therapy:  Anti-Xa level 0.6-1 units/ml 4hrs after LMWH dose given Monitor platelets by anticoagulation protocol: Yes   Plan:  -Start Lovenox 1 mg/kg (75 mg) twice daily with low threshold to decrease dose to once daily based on renal fx -Monitor CBC and s/s of bleeding   Albertina Parr, PharmD., BCPS Clinical Pharmacist Clinical phone for 03/03/18 until 5pm: [phone]

## 2019-03-04 NOTE — Progress Notes (Signed)
Samuel Shaffer  [id number] DOB: [birth year] DOA: 03/15/2019 PCP: [name], MD    Brief Narrative:  86 year old SNF resident with a history of HTN, HLD, WPW, paroxysmal atrial fibrillation on chronic anticoagulation, bladder cancer status post resection, frequent UTIs, AAA, and hypothyroidism who presented to the [facility] ED with several days of of fever and shortness of breath.  He was found to have a fever as high as 103 with oxygen saturations 87% on 6 L.  CXR noted a left patchy opacity consistent with a pneumonia.  The patient also had multiple bouts of watery diarrhea while in the ED.  Significant Events: 1/4 admit to [facility] via [facility] ED 1/5 early a.m. transfer to ICU  COVID-19 specific Treatment: Decadron 1/4 > Remdesivir 1/4 >  Antimicrobials:  Rocephin 1/4 >  Subjective: Since his arrival at [facility] the patient has been very agitated.  He repeatedly pulls off his nasal cannula oxygen support and his facemask oxygen and threatens to strike the staff when they are attempting to replace it.  He rapidly desaturates upon removing his oxygen and it takes him a prolonged period of time to recover.  He has been in refractory atrial fibrillation which is not responded to fluid challenges or IV diltiazem.   He is hard of hearing but he is alert.  The extent to which he comprehends my conversation with him is not clear.  Nonetheless he consistently indicates to me that he does not wish to be put on a ventilator when I ask him multiple different ways.  He appears agitated and uncomfortable.  He is in respiratory distress.  Please see my separate note regarding his CODE STATUS.  In short subjecting this gentleman to CPR, defibrillation, or intubation/mechanical ventilation is felt to represent futile care.  This decision is not being made based upon his response to my questioning but is instead made upon the medical facts as interpreted independently by myself as  well as the critical care physician who has seen him in consultation this morning.  Assessment & Plan:  Sepsis Likely due to combination of viral pneumonia as well as bacterial UTI -treating both -supporting with volume resuscitation but this must be balanced against his worsening oxygen needs  COVID Pneumonia -severe acute hypoxic respiratory failure Requiring nonrebreather and heated high flow -continue remdesivir and Decadron -fighting out against our assistance -attempt to sedate the patient with nonbenzodiazepine medications in order to allow him to be more comfortable and to facilitate cooperation with ongoing aggressive medical care -monitor closely for progressive pulmonary edema/ARDS  Covid gastroenteritis Contributing to volume depletion -carefully volume resuscitating  Hypovolemia -hyponatremia As discussed above volume resuscitation must be administered judiciously  Urinary tract infection -chronic recurring Continue empiric Rocephin while awaiting culture data  Acute kidney injury Creatinine 1.76 at admission with no history of CKD -prerenal azotemia versus ATN in the setting of sepsis -judicious volume resuscitation -monitor trend  Atrial fibrillation with refractory RVR Blood pressure unstable with attempts to use Cardizem drip therefore transition to short-term amiodarone drip -as above judiciously volume resuscitate -maximize electrolyte balance  Hypomagnesemia Supplement to goal of 2.0 in setting of atrial arrhythmia  Elevated troponin No suspicion for acute coronary syndrome but patient would not presently be a candidate for acute intervention regardless -suspect this is demand related ischemia in setting of septic shock  Transaminitis Mild -likely due to Covid or shock liver or both -monitor trend  Anemia of unclear etiology Monitor hemoglobin trend  History  of WPW  AAA  DVT prophylaxis: Eliquis Code Status: DNR - NO CODE Family Communication: Spoke with  patient's HCPOA/ex-wife as well as his son on 2 different occasions this morning Disposition Plan: ICU -ongoing aggressive care short of intubation, mechanical ventilation, CPR, or defibrillation  Consultants:  none  Objective: Blood pressure 114/67, pulse 73, temperature 98.9 F (37.2 C), temperature source Axillary, resp. rate (!) 31, height 5\' 10"  (1.778 m), weight 73.5 kg, SpO2 (!) 45 %.  Intake/Output Summary (Last 24 hours) at 03/04/2019 1803 Last data filed at 03/04/2019 0600 Gross per 24 hour  Intake 0 ml  Output 600 ml  Net -600 ml   Filed Weights   03/04/2019 0905  Weight: 73.5 kg    Examination: General: Obvious respiratory distress with agitation Lungs: Fine crackles diffusely with no wheezing Cardiovascular: Irregularly irregular without appreciable murmur or rub Abdomen: Thin, soft, bowel sounds not appreciated, no mass, no rebound Extremities: No cyanosis clubbing or edema bilateral lower extremities Neuro: Will follow some simple commands, cranial nerves symmetric without obvious deficit though patient will not fully cooperate with exam, moves all 4 extremities spontaneously  CBC: Recent Labs  Lab 03/22/2019 0912 03/24/2019 1303 03/15/2019 2347 03/04/19 0530  WBC 7.6  --   --  9.7  NEUTROABS 6.5  --   --  8.3*  HGB 10.3* 11.2* 12.6* 10.6*  HCT 32.7* 33.0* 37.0* 33.3*  MCV 80.3  --   --  80.6  PLT 251  --   --  0000000   Basic Metabolic Panel: Recent Labs  Lab 03/21/2019 0912 03/30/2019 1303 03/17/2019 2347 03/04/19 0530  NA 133* 136 139 139  K 4.3 3.9 4.2 3.8  CL 101  --   --  108  CO2 21*  --   --  18*  GLUCOSE 100*  --   --  115*  BUN 45*  --   --  47*  CREATININE 1.76*  --   --  1.63*  CALCIUM 7.3*  --   --  7.0*  MG  --   --   --  1.7  PHOS  --   --   --  3.7   GFR: Estimated Creatinine Clearance: 31.1 mL/min (A) (by C-G formula based on SCr of 1.63 mg/dL (H)).  Liver Function Tests: Recent Labs  Lab 03/04/2019 0912 03/04/19 0530  AST 115* 128*  ALT  33 40  ALKPHOS 77 84  BILITOT 1.2 0.6  PROT 5.6* 5.4*  ALBUMIN 2.2* 2.2*   Recent Labs  Lab 03/13/2019 0912  LIPASE 59*    Scheduled Meds: . Chlorhexidine Gluconate Cloth  6 each Topical Daily  . dexamethasone (DECADRON) injection  6 mg Intravenous Q24H  . enoxaparin (LOVENOX) injection  1 mg/kg Subcutaneous Q12H  . [START ON 03/05/2019] influenza vaccine adjuvanted  0.5 mL Intramuscular Tomorrow-1000  . metoprolol tartrate  5 mg Intravenous Q6H  . pantoprazole (PROTONIX) IV  40 mg Intravenous Q24H  . sodium chloride flush  3 mL Intravenous Q12H   Continuous Infusions: . sodium chloride 50 mL/hr at 03/04/19 0857  . amiodarone 30 mg/hr (03/04/19 1608)  . cefTRIAXone (ROCEPHIN)  IV Stopped (03/25/2019 1307)  . remdesivir 100 mg in NS 100 mL 100 mg (03/04/19 0934)  . sodium chloride Stopped (03/04/19 0700)     LOS: 1 day   Cherene Altes, MD [facility] Office  [phone] Pager - Text Page per Amion  If 7PM-7AM, please contact night-coverage per Amion 03/04/2019, 6:03 PM

## 2019-03-04 NOTE — Plan of Care (Signed)
  Problem: Education: Goal: Knowledge of risk factors and measures for prevention of condition will improve Outcome: Not Progressing   Problem: Coping: Goal: Psychosocial and spiritual needs will be supported Outcome: Not Progressing   Problem: Respiratory: Goal: Will maintain a patent airway Outcome: Not Progressing Goal: Complications related to the disease process, condition or treatment will be avoided or minimized Outcome: Not Progressing   

## 2019-03-05 ENCOUNTER — Inpatient Hospital Stay (HOSPITAL_COMMUNITY): Payer: Medicare Other

## 2019-03-05 LAB — CBC WITH DIFFERENTIAL/PLATELET
Abs Immature Granulocytes: 0.12 10*3/uL — ABNORMAL HIGH (ref 0.00–0.07)
Basophils Absolute: 0 10*3/uL (ref 0.0–0.1)
Basophils Relative: 0 %
Eosinophils Absolute: 0 10*3/uL (ref 0.0–0.5)
Eosinophils Relative: 0 %
HCT: 32.7 % — ABNORMAL LOW (ref 39.0–52.0)
Hemoglobin: 10 g/dL — ABNORMAL LOW (ref 13.0–17.0)
Immature Granulocytes: 1 %
Lymphocytes Relative: 10 %
Lymphs Abs: 1.4 10*3/uL (ref 0.7–4.0)
MCH: 25.1 pg — ABNORMAL LOW (ref 26.0–34.0)
MCHC: 30.6 g/dL (ref 30.0–36.0)
MCV: 82 fL (ref 80.0–100.0)
Monocytes Absolute: 0.6 10*3/uL (ref 0.1–1.0)
Monocytes Relative: 4 %
Neutro Abs: 11.4 10*3/uL — ABNORMAL HIGH (ref 1.7–7.7)
Neutrophils Relative %: 85 %
Platelets: 239 10*3/uL (ref 150–400)
RBC: 3.99 MIL/uL — ABNORMAL LOW (ref 4.22–5.81)
RDW: 18.6 % — ABNORMAL HIGH (ref 11.5–15.5)
WBC: 13.5 10*3/uL — ABNORMAL HIGH (ref 4.0–10.5)
nRBC: 0 % (ref 0.0–0.2)

## 2019-03-05 LAB — COMPREHENSIVE METABOLIC PANEL
ALT: 41 U/L (ref 0–44)
AST: 136 U/L — ABNORMAL HIGH (ref 15–41)
Albumin: 2.2 g/dL — ABNORMAL LOW (ref 3.5–5.0)
Alkaline Phosphatase: 112 U/L (ref 38–126)
Anion gap: 12 (ref 5–15)
BUN: 61 mg/dL — ABNORMAL HIGH (ref 8–23)
CO2: 19 mmol/L — ABNORMAL LOW (ref 22–32)
Calcium: 6.9 mg/dL — ABNORMAL LOW (ref 8.9–10.3)
Chloride: 112 mmol/L — ABNORMAL HIGH (ref 98–111)
Creatinine, Ser: 2.09 mg/dL — ABNORMAL HIGH (ref 0.61–1.24)
GFR calc Af Amer: 31 mL/min — ABNORMAL LOW (ref >60)
GFR calc non Af Amer: 27 mL/min — ABNORMAL LOW (ref >60)
Glucose, Bld: 134 mg/dL — ABNORMAL HIGH (ref 70–99)
Potassium: 5.3 mmol/L — ABNORMAL HIGH (ref 3.5–5.1)
Sodium: 143 mmol/L (ref 135–145)
Total Bilirubin: 0.6 mg/dL (ref 0.3–1.2)
Total Protein: 5.3 g/dL — ABNORMAL LOW (ref 6.5–8.1)

## 2019-03-05 LAB — TSH: TSH: 5.796 u[IU]/mL — ABNORMAL HIGH (ref 0.350–4.500)

## 2019-03-05 LAB — PHOSPHORUS: Phosphorus: 4.8 mg/dL — ABNORMAL HIGH (ref 2.5–4.6)

## 2019-03-05 LAB — C-REACTIVE PROTEIN: CRP: 15.1 mg/dL — ABNORMAL HIGH (ref <1.0)

## 2019-03-05 LAB — FERRITIN: Ferritin: 443 ng/mL — ABNORMAL HIGH (ref 24–336)

## 2019-03-05 LAB — MAGNESIUM: Magnesium: 2.5 mg/dL — ABNORMAL HIGH (ref 1.7–2.4)

## 2019-03-05 LAB — D-DIMER, QUANTITATIVE: D-Dimer, Quant: 2.62 ug/mL-FEU — ABNORMAL HIGH (ref 0.00–0.50)

## 2019-03-05 MED ORDER — GLYCOPYRROLATE 0.2 MG/ML IJ SOLN: 0.2000 mg | INTRAMUSCULAR | Status: DC | PRN

## 2019-03-05 MED ORDER — HALOPERIDOL LACTATE 2 MG/ML PO CONC
0.5000 mg | ORAL | Status: DC | PRN
  Filled 2019-03-05: qty 0.3

## 2019-03-05 MED ORDER — ONDANSETRON HCL 4 MG/2ML IJ SOLN: 4.0000 mg | Freq: Four times a day (QID) | INTRAMUSCULAR | Status: DC | PRN

## 2019-03-05 MED ORDER — GLYCOPYRROLATE 1 MG PO TABS
1.0000 mg | ORAL_TABLET | ORAL | Status: DC | PRN
  Filled 2019-03-05: qty 1

## 2019-03-05 MED ORDER — HYDROMORPHONE HCL 1 MG/ML IJ SOLN
0.5000 mg | INTRAMUSCULAR | Status: DC | PRN
  Administered 2019-03-06 (×2): 1 mg via INTRAVENOUS
  Filled 2019-03-05 (×2): qty 1

## 2019-03-05 MED ORDER — BIOTENE DRY MOUTH MT LIQD: 15.0000 mL | OROMUCOSAL | Status: DC | PRN

## 2019-03-05 MED ORDER — ENOXAPARIN SODIUM 80 MG/0.8ML ~~LOC~~ SOLN: 1.0000 mg/kg | SUBCUTANEOUS | Status: DC

## 2019-03-05 MED ORDER — HALOPERIDOL LACTATE 5 MG/ML IJ SOLN
0.5000 mg | INTRAMUSCULAR | Status: DC | PRN
  Administered 2019-03-05 – 2019-03-06 (×3): 0.5 mg via INTRAVENOUS
  Filled 2019-03-05 (×3): qty 1

## 2019-03-05 MED ORDER — ACETAMINOPHEN 325 MG PO TABS: 650.0000 mg | ORAL_TABLET | Freq: Four times a day (QID) | ORAL | Status: DC | PRN

## 2019-03-05 MED ORDER — ACETAMINOPHEN 650 MG RE SUPP: 650.0000 mg | Freq: Four times a day (QID) | RECTAL | Status: DC | PRN

## 2019-03-05 MED ORDER — POLYVINYL ALCOHOL 1.4 % OP SOLN
1.0000 [drp] | Freq: Four times a day (QID) | OPHTHALMIC | Status: DC | PRN
  Filled 2019-03-05: qty 15

## 2019-03-05 MED ORDER — HALOPERIDOL 0.5 MG PO TABS
0.5000 mg | ORAL_TABLET | ORAL | Status: DC | PRN
  Filled 2019-03-05: qty 1

## 2019-03-05 MED ORDER — GLYCOPYRROLATE 0.2 MG/ML IJ SOLN
0.2000 mg | INTRAMUSCULAR | Status: DC | PRN
  Administered 2019-03-05 – 2019-03-06 (×2): 0.2 mg via INTRAVENOUS
  Filled 2019-03-05 (×2): qty 1

## 2019-03-05 MED ORDER — ONDANSETRON 4 MG PO TBDP
4.0000 mg | ORAL_TABLET | Freq: Four times a day (QID) | ORAL | Status: DC | PRN
  Filled 2019-03-05: qty 1

## 2019-03-05 NOTE — Progress Notes (Signed)
Assisted tele visit to patient with multiple family members.  Joslyne Marshburn Anderson, RN   

## 2019-03-05 NOTE — Progress Notes (Signed)
ANTICOAGULATION CONSULT NOTE - Follow Up Consult  Pharmacy Consult for Lovenox  Indication: atrial fibrillation  No Known Allergies  Patient Measurements: Height: 5\' 10"  (177.8 cm) Weight: 162 lb 0.6 oz (73.5 kg) IBW/kg (Calculated) : 73  Vital Signs: Temp: 98.9 F (37.2 C) (01/06 0800) Temp Source: Axillary (01/06 0800) BP: 112/49 (01/06 0800) Pulse Rate: 71 (01/06 0800)  Labs: Recent Labs    03/26/2019 0912 03/29/2019 1212 03/30/2019 2347 03/04/19 0530 03/05/19 0239  HGB 10.3*  --  12.6* 10.6* 10.0*  HCT 32.7*  --  37.0* 33.3* 32.7*  PLT 251  --   --  243 239  CREATININE 1.76*  --   --  1.63* 2.09*  TROPONINIHS 77* 71*  --   --   --     Estimated Creatinine Clearance: 24.3 mL/min (A) (by C-G formula based on SCr of 2.09 mg/dL (H)).   Medical History: Past Medical History:  Diagnosis Date  . A-fib ([place])   . Abdominal aortic aneurysm (AAA) ([place])   . Anemia   . Arthritis   . Bladder cancer [facility])    s/p resection  . Closed left acetabular fracture ([place])   . Gout   . History of GI diverticular bleed   . Hyperlipidemia   . Hypertension   . Hypothyroidism   . Pressure ulcer   . Stroke ([place])   . Wolff-Parkinson-White (WPW) pattern     Medications:  Medications Prior to Admission  Medication Sig Dispense Refill Last Dose  . acetaminophen (TYLENOL) 325 MG tablet Take 650 mg by mouth every 4 (four) hours as needed for fever.   03/02/2019 at Unknown time  . allopurinol (ZYLOPRIM) 300 MG tablet Take 300 mg by mouth daily.   03/23/2019 at Unknown time  . Amino Acids-Protein Hydrolys (FEEDING SUPPLEMENT, PRO-STAT SUGAR FREE 64,) LIQD Take 30 mLs by mouth daily.   03/27/2019 at Unknown time  . diltiazem (CARDIZEM CD) 240 MG 24 hr capsule Take 240 mg by mouth daily.    03/02/2019 at Unknown time  . ELIQUIS 2.5 MG TABS tablet Take 2.5 mg by mouth 2 (two) times daily.    03/21/2019 at 0800  . ferrous sulfate 325 (65 FE) MG EC tablet Take 325 mg by mouth daily.    03/02/2019 at Unknown  time  . FOSAMAX 70 MG tablet Take 70 mg by mouth once a week. Tuesday   02/25/2019  . furosemide (LASIX) 40 MG tablet Take 40 mg by mouth daily.    03/02/2019 at Unknown time  . metoprolol tartrate (LOPRESSOR) 25 MG tablet Take 25 mg by mouth 2 (two) times daily.    03/09/2019 at 0800  . Multiple Vitamins-Minerals (PRESERVISION AREDS 2 PO) Take 1 capsule by mouth daily.    03/12/2019 at Unknown time  . potassium chloride (K-DUR) 10 MEQ tablet Take 10 mEq by mouth daily.   03/26/2019 at Unknown time  . SYNTHROID 150 MCG tablet Take 150 mcg by mouth daily before breakfast.    03/25/2019 at 0600  . Vitamin D, Cholecalciferol, 50 MCG (2000 UT) CAPS Take 2,000 Units by mouth daily.    03/14/2019 at Unknown time    Assessment: 33 YOM with h/o of AFib on apixaban at home admitted with COVID-19 pneumonia. Pharmacy consulted to transition patient from apixaban to SQ Lovenox. Of note, patient's SCr is elevated and CrCl is ~ 30 mL/min. H/H trending down. Plt wnl   Last dose of apixaban was yesterday afternoon   Goal of Therapy:  Anti-Xa level 0.6-1 units/ml 4hrs after LMWH dose given Monitor platelets by anticoagulation protocol: Yes   Plan:  -Decrease Lovenox to 1 mg/kg (75 mg) once daily due to worsening renal fx  -Monitor CBC and s/s of bleeding   Albertina Parr, PharmD., BCPS Clinical Pharmacist Clinical phone for 03/04/18 until 5pm: [phone]

## 2019-03-05 NOTE — Progress Notes (Signed)
PROGRESS NOTE  Samuel Shaffer  [id number] DOB: 24-Sep-1926 DOA: 03/27/2019 PCP: [name], MD   Brief Narrative: Samuel Shaffer is a 86 y.o. male with a history of HTN, HLD, WPW, paroxysmal atrial fibrillation on chronic anticoagulation, bladder cancer s/p resection, frequent UTIs, AAA, and hypothyroidism who presented to the [facility], ED with several days of of fever and shortness of breath.  He was found to have a fever as high as 103 with oxygen saturations 87% on 6 L. CXR noted a left patchy opacity consistent with a pneumonia.  The patient also had multiple bouts of watery diarrhea while in the ED. He was admitted 1/4 to [place] 1/5 on remdesivir, steroids, and ceftriaxone. He developed agitated delirium and worsening hypoxia over the next 24 hours prompting transfer to ICU. On 1/6, after 3 days of treatment, he is experiencing hypoxia refractory to 15L NRB + 15L HFNC and will not tolerate heated high flow. In fact, haldol is required to keep a mask on him. Repeat CXR demonstrates advancing bilateral patchy airspace opacities, inflammatory markers continue to rise, and there is now evidence of renal failure, worsening transaminitis, and ongoing encephalopathy. Goals of care discussions were broached with the patient when he was in an alert state, and seemed to indicate the desire not to be intubated and put on a ventilator, though his capacity to make medical decisions was unclear. The patient was made DNR per 2 physician assessment of medical futility in the event of cardiac arrest. HCPOA and the patient's son were contacted again on 1/6 and in light of worsening progression of multiple organ failure, the goals of care are to become centered around comfort and dignity.   Assessment & Plan: Principal Problem:   Acute respiratory failure with hypoxia (HCC) Active Problems:   Hypothyroidism   Wolff-Parkinson-White (WPW) pattern   Hypochromic anemia   Abdominal aortic aneurysm without  rupture (HCC)   Abdominal aortic aneurysm (AAA) (HCC)   Pneumonia due to COVID-19 virus   AKI (acute kidney injury) ([place])   Sepsis ([place])   Urinary tract infection   Pressure injury of skin  Sepsis, POA: Due to viral pneumonia and bacterial UTI.  - Stop IVF due to worsening hypoxia. Unfortunately this will likely worsen renal failure.  - Giving abx + antiviral   Acute hypoxic respiratory failure due to covid-19 pneumonia: ABG has confirmed severe hypoxia. PCCM consulted and confirms my medical opinion: "I do not believe Mr Meineke can or would survive mechanical ventilation or CPR if he continues to have progression of his resp failure. For that reason, I would not offer these things. - Treated with remdesivir and steroids - Have to stop IVF/attempt negative balance - Will not tolerate proning, cooperative with incentive spirometry or flutter valve.  - Unable to provide plasma as I suspect this would lead to volume overload and this is not a proven therapy. Unable to provide tocilizumab in presence of known bacterial infection.  - Treat air hunger with dilaudid due to progressive renal failure. Will consult palliative care team for further recommendations.  Acute delirium, acute metabolic encephalopathy and suspected acute hypoxic encephalopathy: Direct effect of covid and renal failure likely contributing.  - Delirium precautions initiated and prn haldol provided with no improvement.  - Continue supportive care and treatment of anxiety with prn ativan  Acute renal failure with hyperkalemia and NAGMA: POA due to sepsis and worsening. - Unable to continue IVF. Hemodynamics worsening. Will not cooperate with kayexalate, etc.  Recurrent UTI: Due to Enterobacter aerogenes.  - Has been treated with ceftriaxone. Blood cultures NGTD, susceptibilities not available on urine culture at this time.   LFT elevation: Mild, not a contraindication to remdesivir at this time.   PAF with RVR: Complicated  by hypotension.  - Amiodarone infusion. Cardizem gtt caused hypotension. - Anticoagulation  Demand myocardial ischemia: No ACS evident.   History of WPW: Noted  AAA: Noted  Covid-19 gastroenteritis: Symptoms have improved.   Hypovolemic hyponatremia:  - Must stop judicious IVF  Anemia of acute illness: No bleeding is evident.   RN Pressure Injury Documentation: Pressure Injury Sacrum Stage 2 -  Partial thickness loss of dermis presenting as a shallow open injury with a red, pink wound bed without slough. (Active)     Location: Sacrum  Location Orientation:   Staging: Stage 2 -  Partial thickness loss of dermis presenting as a shallow open injury with a red, pink wound bed without slough.  Wound Description (Comments):   Present on Admission:    DVT prophylaxis: Lovenox Code Status: DNR Family Communication: Wife and Son separately by phone Disposition Plan: Will facilitate video conference between family and patient with anticipation of transition to comfort measures afterward.  Consultants:   PCCM  Palliative care team  Procedures:   None  Antimicrobials:  Remdesivir, ceftriaxone   Subjective: Tracks with eyes, swats at my hand when I attempt to examine him, then takes off covers, not following commands. Does not answer my questions.   Objective: Vitals:   03/05/19 1000 03/05/19 1030 03/05/19 1100 03/05/19 1130  BP: (!) 123/55  (!) 125/44   Pulse: 64 63 67 73  Resp: 14 15 15 16   Temp:      TempSrc:      SpO2: (!) 68% (!) 68% (!) 55% (!) 42%  Weight:      Height:        Intake/Output Summary (Last 24 hours) at 03/05/2019 1556 Last data filed at 03/05/2019 0301 Gross per 24 hour  Intake 1655.02 ml  Output 1125 ml  Net 530.02 ml   Filed Weights   03/09/2019 0905  Weight: 73.5 kg    Gen: Elderly male in no acute distress Pulm: Non-labored tachypnea while sleeping, more effort evident when awoken. Coarse throughout. CV: Irreg. No murmur, rub, or  gallop. No JVD, no pitting pedal edema. GI: Abdomen soft, scaphoid, non-tender, non-distended, with normoactive bowel sounds. No organomegaly or masses felt. Ext: Warm, no deformities Skin: No new rashes, lesions or ulcers. No active bleeding. Neuro: Rousable, not following commands, moves all extremities. Psych: Judgement and insight impaired.   Data Reviewed: I have personally reviewed following labs and imaging studies  CBC: Recent Labs  Lab 03/28/2019 0912 03/30/2019 1303 03/19/2019 2347 03/04/19 0530 03/05/19 0239  WBC 7.6  --   --  9.7 13.5*  NEUTROABS 6.5  --   --  8.3* 11.4*  HGB 10.3* 11.2* 12.6* 10.6* 10.0*  HCT 32.7* 33.0* 37.0* 33.3* 32.7*  MCV 80.3  --   --  80.6 82.0  PLT 251  --   --  243 [id number]   Basic Metabolic Panel: Recent Labs  Lab 03/22/2019 0912 03/12/2019 1303 03/20/2019 2347 03/04/19 0530 03/05/19 0239  NA 133* 136 139 139 143  K 4.3 3.9 4.2 3.8 5.3*  CL 101  --   --  108 112*  CO2 21*  --   --  18* 19*  GLUCOSE 100*  --   --  115*  134*  BUN 45*  --   --  47* 61*  CREATININE 1.76*  --   --  1.63* 2.09*  CALCIUM 7.3*  --   --  7.0* 6.9*  MG  --   --   --  1.7 2.5*  PHOS  --   --   --  3.7 4.8*   GFR: Estimated Creatinine Clearance: 24.3 mL/min (A) (by C-G formula based on SCr of 2.09 mg/dL (H)). Liver Function Tests: Recent Labs  Lab 03/02/2019 0912 03/04/19 0530 03/05/19 0239  AST 115* 128* 136*  ALT 33 40 41  ALKPHOS 77 84 112  BILITOT 1.2 0.6 0.6  PROT 5.6* 5.4* 5.3*  ALBUMIN 2.2* 2.2* 2.2*   Recent Labs  Lab 03/12/2019 0912  LIPASE 59*   No results for input(s): AMMONIA in the last 168 hours. Coagulation Profile: No results for input(s): INR, PROTIME in the last 168 hours. Cardiac Enzymes: No results for input(s): CKTOTAL, CKMB, CKMBINDEX, TROPONINI in the last 168 hours. BNP (last 3 results) No results for input(s): PROBNP in the last 8760 hours. HbA1C: No results for input(s): HGBA1C in the last 72 hours. CBG: No results for input(s):  GLUCAP in the last 168 hours. Lipid Profile: No results for input(s): CHOL, HDL, LDLCALC, TRIG, CHOLHDL, LDLDIRECT in the last 72 hours. Thyroid Function Tests: Recent Labs    03/05/19 0239  TSH 5.796*   Anemia Panel: Recent Labs    03/04/19 0530 03/05/19 0239  FERRITIN 287 443*   Urine analysis:    Component Value Date/Time   COLORURINE GREEN (A) 03/01/2019 0912   APPEARANCEUR TURBID (A) 03/29/2019 0912   LABSPEC 1.025 03/14/2019 0912   PHURINE 6.0 03/28/2019 0912   GLUCOSEU NEGATIVE 03/16/2019 0912   HGBUR LARGE (A) 03/13/2019 0912   BILIRUBINUR NEGATIVE 03/13/2019 0912   KETONESUR 15 (A) 03/22/2019 0912   PROTEINUR 100 (A) 03/04/2019 0912   NITRITE NEGATIVE 03/21/2019 0912   LEUKOCYTESUR MODERATE (A) 03/05/2019 0912   Recent Results (from the past 240 hour(s))  Urine culture     Status: Abnormal (Preliminary result)   Collection Time: 03/07/2019  9:12 AM   Specimen: Urine, Random  Result Value Ref Range Status   Specimen Description URINE, RANDOM  Final   Special Requests NONE  Final   Culture (A)  Final    60,000 COLONIES/mL ENTEROBACTER AEROGENES SUSCEPTIBILITIES TO FOLLOW CULTURE REINCUBATED FOR BETTER GROWTH Performed at [facility], [place] [address], [place], [place] [address]    Report Status PENDING  Incomplete  Blood culture (routine x 2)     Status: None (Preliminary result)   Collection Time: 03/02/2019  9:30 AM   Specimen: BLOOD  Result Value Ref Range Status   Specimen Description BLOOD SITE NOT SPECIFIED  Final   Special Requests   Final    BOTTLES DRAWN AEROBIC ONLY Blood Culture results may not be optimal due to an inadequate volume of blood received in culture bottles   Culture   Final    NO GROWTH 2 DAYS Performed at [facility], [place] [address] [place], [place] [address]    Report Status PENDING  Incomplete  Blood culture (routine x 2)     Status: None (Preliminary result)   Collection Time: 03/24/2019 10:18 AM   Specimen: BLOOD  RIGHT FOREARM  Result Value Ref Range Status   Specimen Description BLOOD RIGHT FOREARM  Final   Special Requests   Final    BOTTLES DRAWN AEROBIC AND  ANAEROBIC Blood Culture adequate volume   Culture   Final    NO GROWTH 2 DAYS Performed at [facility], [place] [address], [place], [place] [address]    Report Status PENDING  Incomplete      Radiology Studies: DG Chest Port 1 View  Result Date: 03/05/2019 CLINICAL DATA:  COVID-19 pneumonia. EXAM: PORTABLE CHEST 1 VIEW COMPARISON:  03/04/2019 FINDINGS: The patient is rotated to the left, more so than on the prior study limiting assessment of the mediastinal structures. There are worsening interstitial and airspace opacities diffusely throughout both lungs. No sizable pleural effusion or pneumothorax is identified. IMPRESSION: Worsening diffuse bilateral lung opacities compatible with pneumonia with superimposed edema not excluded. Electronically Signed   By: Logan Bores M.D.   On: 03/05/2019 08:50    Scheduled Meds: . Chlorhexidine Gluconate Cloth  6 each Topical Daily  . dexamethasone (DECADRON) injection  6 mg Intravenous Q24H  . enoxaparin (LOVENOX) injection  1 mg/kg Subcutaneous Q24H  . mouth rinse  15 mL Mouth Rinse BID  . metoprolol tartrate  5 mg Intravenous Q6H  . pantoprazole (PROTONIX) IV  40 mg Intravenous Q24H  . sodium chloride flush  3 mL Intravenous Q12H   Continuous Infusions: . sodium chloride 50 mL/hr at 03/05/19 1049  . amiodarone 30 mg/hr (03/05/19 1433)  . cefTRIAXone (ROCEPHIN)  IV 1 g (03/05/19 1231)  . remdesivir 100 mg in NS 100 mL 100 mg (03/05/19 1004)  . sodium chloride Stopped (03/04/19 0700)     LOS: 2 days   Time spent: 35 minutes.  Patrecia Pour, MD [facility] [email or web address] 03/05/2019, 3:56 PM

## 2019-03-06 LAB — URINE CULTURE: Culture: 60000 — AB

## 2019-03-06 MED ORDER — HYDROMORPHONE HCL 1 MG/ML IJ SOLN
0.5000 mg | INTRAMUSCULAR | Status: DC | PRN
  Administered 2019-03-06 (×2): 1 mg via INTRAVENOUS
  Filled 2019-03-06 (×2): qty 1

## 2019-03-08 LAB — CULTURE, BLOOD (ROUTINE X 2)
Culture: NO GROWTH
Culture: NO GROWTH
Special Requests: ADEQUATE

## 2019-03-31 NOTE — Progress Notes (Signed)
Patient passed away at 0830. POA Georgiana [facility] called and informed.

## 2019-03-31 NOTE — Death Summary Note (Signed)
DEATH SUMMARY   Patient Details  Name: Samuel Shaffer MRN: [id number] DOB: [birth year]  Admission/Discharge Information   Admit Date:  Apr 01, 2019  Date of Death: Date of Death: 04/04/2019  Time of Death: Time of Death: 0830  Length of Stay: 3  Referring Physician: [name], MD   Reason(s) for Hospitalization  Respiratory distress  Diagnoses  Preliminary cause of death: Acute hypoxemic respiratory failure due to covid-19 pneumonia. Secondary Diagnoses (including complications and co-morbidities):  Principal Problem:   Acute respiratory failure with hypoxia (HCC) Active Problems:   Hypothyroidism   Wolff-Parkinson-White (WPW) pattern   Hypochromic anemia   Abdominal aortic aneurysm without rupture (HCC)   Abdominal aortic aneurysm (AAA) (HCC)   Pneumonia due to COVID-19 virus   AKI (acute kidney injury) ([place])   Sepsis ([place])   Urinary tract infection   Pressure injury of skin   Brief Hospital Course (including significant findings, care, treatment, and services provided and events leading to death)  Samuel Shaffer is a 86 y.o. male with a history of HTN, HLD, WPW, paroxysmal atrial fibrillation on chronic anticoagulation, bladder cancer s/p resection, frequent UTIs, AAA, and hypothyroidism who presented to the [facility], ED with several days of fever and shortness of breath. He was found to have a fever as high as 103 with oxygen saturations 87% on 6 L in respiratory distress.CXR noted a left patchy opacity consistent with a pneumonia. The patient also had multiple bouts of watery diarrhea while in the ED. He was admitted 1/4 to [facility] 1/5 on remdesivir, steroids, and ceftriaxone to treat UTI. He developed agitated delirium and worsening hypoxia over the next 24 hours prompting transfer to ICU. On 1/6, after 3 days of treatment, he is experiencing hypoxia refractory to 15L NRB + 15L HFNC and would not tolerate heated high flow. In fact, haldol is required to keep a mask on  him. Repeat CXR demonstrates advancing bilateral patchy airspace opacities, inflammatory markers continue to rise, and there is now evidence of acute renal failure, worsening transaminitis, and ongoing multifactorial encephalopathy. Goals of care discussions were broached with the patient when he was in an alert state, and seemed to indicate the desire not to be intubated and put on a ventilator, though his capacity to make medical decisions was unclear. The patient was made DNR per 2 physician assessment of medical futility in the event of cardiac arrest. HCPOA and the patient's son were contacted again on 1/6 and in light of worsening progression of multiple organ failure, the goals of care were agreed to become centered around comfort and dignity with medications to treat respiratory distress. After video conferencing with all family members, the patient later died on 04/03/2022.  Pertinent Labs and Studies  Significant Diagnostic Studies DG Chest Port 1 View  Result Date: 03/05/2019 CLINICAL DATA:  COVID-19 pneumonia. EXAM: PORTABLE CHEST 1 VIEW COMPARISON:  April 01, 2019 FINDINGS: The patient is rotated to the left, more so than on the prior study limiting assessment of the mediastinal structures. There are worsening interstitial and airspace opacities diffusely throughout both lungs. No sizable pleural effusion or pneumothorax is identified. IMPRESSION: Worsening diffuse bilateral lung opacities compatible with pneumonia with superimposed edema not excluded. Electronically Signed   By: Logan Bores M.D.   On: 03/05/2019 08:50   DG Chest Portable 1 View  Result Date: Apr 01, 2019 CLINICAL DATA:  Fever, hypoxia, COVID-19 positive EXAM: PORTABLE CHEST 1 VIEW COMPARISON:  06/20/2015 chest radiograph. FINDINGS: Stable cardiomediastinal silhouette with normal heart size. No pneumothorax.  No pleural effusion. Patchy hazy opacity throughout the left lung appears new. IMPRESSION: New patchy hazy opacity throughout the left  lung, compatible with pneumonia. Electronically Signed   By: Ilona Sorrel M.D.   On: 03/02/2019 09:40    Microbiology Recent Results (from the past 240 hour(s))  Urine culture     Status: Abnormal   Collection Time: 03/22/2019  9:12 AM   Specimen: Urine, Random  Result Value Ref Range Status   Specimen Description URINE, RANDOM  Final   Special Requests NONE  Final   Culture (A)  Final    60,000 COLONIES/mL ENTEROBACTER AEROGENES >=100,000 COLONIES/mL AEROCOCCUS SPECIES Standardized susceptibility testing for this organism is not available. Performed at [facility], [place] [address] [place], [place] [address]    Report Status 04-05-2019 FINAL  Final   Organism ID, Bacteria ENTEROBACTER AEROGENES (A)  Final      Susceptibility   Enterobacter aerogenes - MIC*    CEFAZOLIN RESISTANT Resistant     CEFTRIAXONE <=0.25 SENSITIVE Sensitive     CIPROFLOXACIN <=0.25 SENSITIVE Sensitive     GENTAMICIN <=1 SENSITIVE Sensitive     IMIPENEM 0.5 SENSITIVE Sensitive     NITROFURANTOIN 64 INTERMEDIATE Intermediate     TRIMETH/SULFA <=20 SENSITIVE Sensitive     PIP/TAZO <=4 SENSITIVE Sensitive     * 60,000 COLONIES/mL ENTEROBACTER AEROGENES  Blood culture (routine x 2)     Status: None (Preliminary result)   Collection Time: 03/16/2019  9:30 AM   Specimen: BLOOD  Result Value Ref Range Status   Specimen Description BLOOD SITE NOT SPECIFIED  Final   Special Requests   Final    BOTTLES DRAWN AEROBIC ONLY Blood Culture results may not be optimal due to an inadequate volume of blood received in culture bottles   Culture   Final    NO GROWTH 3 DAYS Performed at [facility], [place] [address], [place], [place] [address]    Report Status PENDING  Incomplete  Blood culture (routine x 2)     Status: None (Preliminary result)   Collection Time: 03/20/2019 10:18 AM   Specimen: BLOOD RIGHT FOREARM  Result Value Ref Range Status   Specimen Description BLOOD RIGHT FOREARM  Final   Special Requests    Final    BOTTLES DRAWN AEROBIC AND ANAEROBIC Blood Culture adequate volume   Culture   Final    NO GROWTH 3 DAYS Performed at [facility], [place] [address] [place], [place] [address]    Report Status PENDING  Incomplete    Lab Basic Metabolic Panel: Recent Labs  Lab 03/08/2019 0912 03/05/2019 1303 03/25/2019 2347 03/04/19 0530 03/05/19 0239  NA 133* 136 139 139 143  K 4.3 3.9 4.2 3.8 5.3*  CL 101  --   --  108 112*  CO2 21*  --   --  18* 19*  GLUCOSE 100*  --   --  115* 134*  BUN 45*  --   --  47* 61*  CREATININE 1.76*  --   --  1.63* 2.09*  CALCIUM 7.3*  --   --  7.0* 6.9*  MG  --   --   --  1.7 2.5*  PHOS  --   --   --  3.7 4.8*   Liver Function Tests: Recent Labs  Lab 03/13/2019 0912 03/04/19 0530 03/05/19 0239  AST 115* 128* 136*  ALT 33 40 41  ALKPHOS 77 84 112  BILITOT 1.2 0.6 0.6  PROT 5.6* 5.4* 5.3*  ALBUMIN 2.2* 2.2* 2.2*   Recent Labs  Lab 03/12/2019 0912  LIPASE 59*   No results for input(s): AMMONIA in the last 168 hours. CBC: Recent Labs  Lab 03/23/2019 0912 03/17/2019 1303 03/10/2019 2347 03/04/19 0530 03/05/19 0239  WBC 7.6  --   --  9.7 13.5*  NEUTROABS 6.5  --   --  8.3* 11.4*  HGB 10.3* 11.2* 12.6* 10.6* 10.0*  HCT 32.7* 33.0* 37.0* 33.3* 32.7*  MCV 80.3  --   --  80.6 82.0  PLT 251  --   --  243 239   Cardiac Enzymes: No results for input(s): CKTOTAL, CKMB, CKMBINDEX, TROPONINI in the last 168 hours. Sepsis Labs: Recent Labs  Lab 03/27/2019 0912 03/20/2019 1212 03/04/19 0530 03/05/19 0239  PROCALCITON  --  0.45  --   --   WBC 7.6  --  9.7 13.5*  LATICACIDVEN 1.9 2.1*  --   --     Procedures/Operations     Patrecia Pour, MD Mar 26, 2019, 1:20 PM

## 2019-03-31 DEATH — deceased

## 2020-11-27 IMAGING — DX DG CHEST 1V PORT
1 series · 1 of 1 positions shown · non-contrast
Comparison: 03/03/2019

CLINICAL DATA: [id number] pneumonia.

EXAM:
PORTABLE CHEST 1 VIEW

[chest ap]
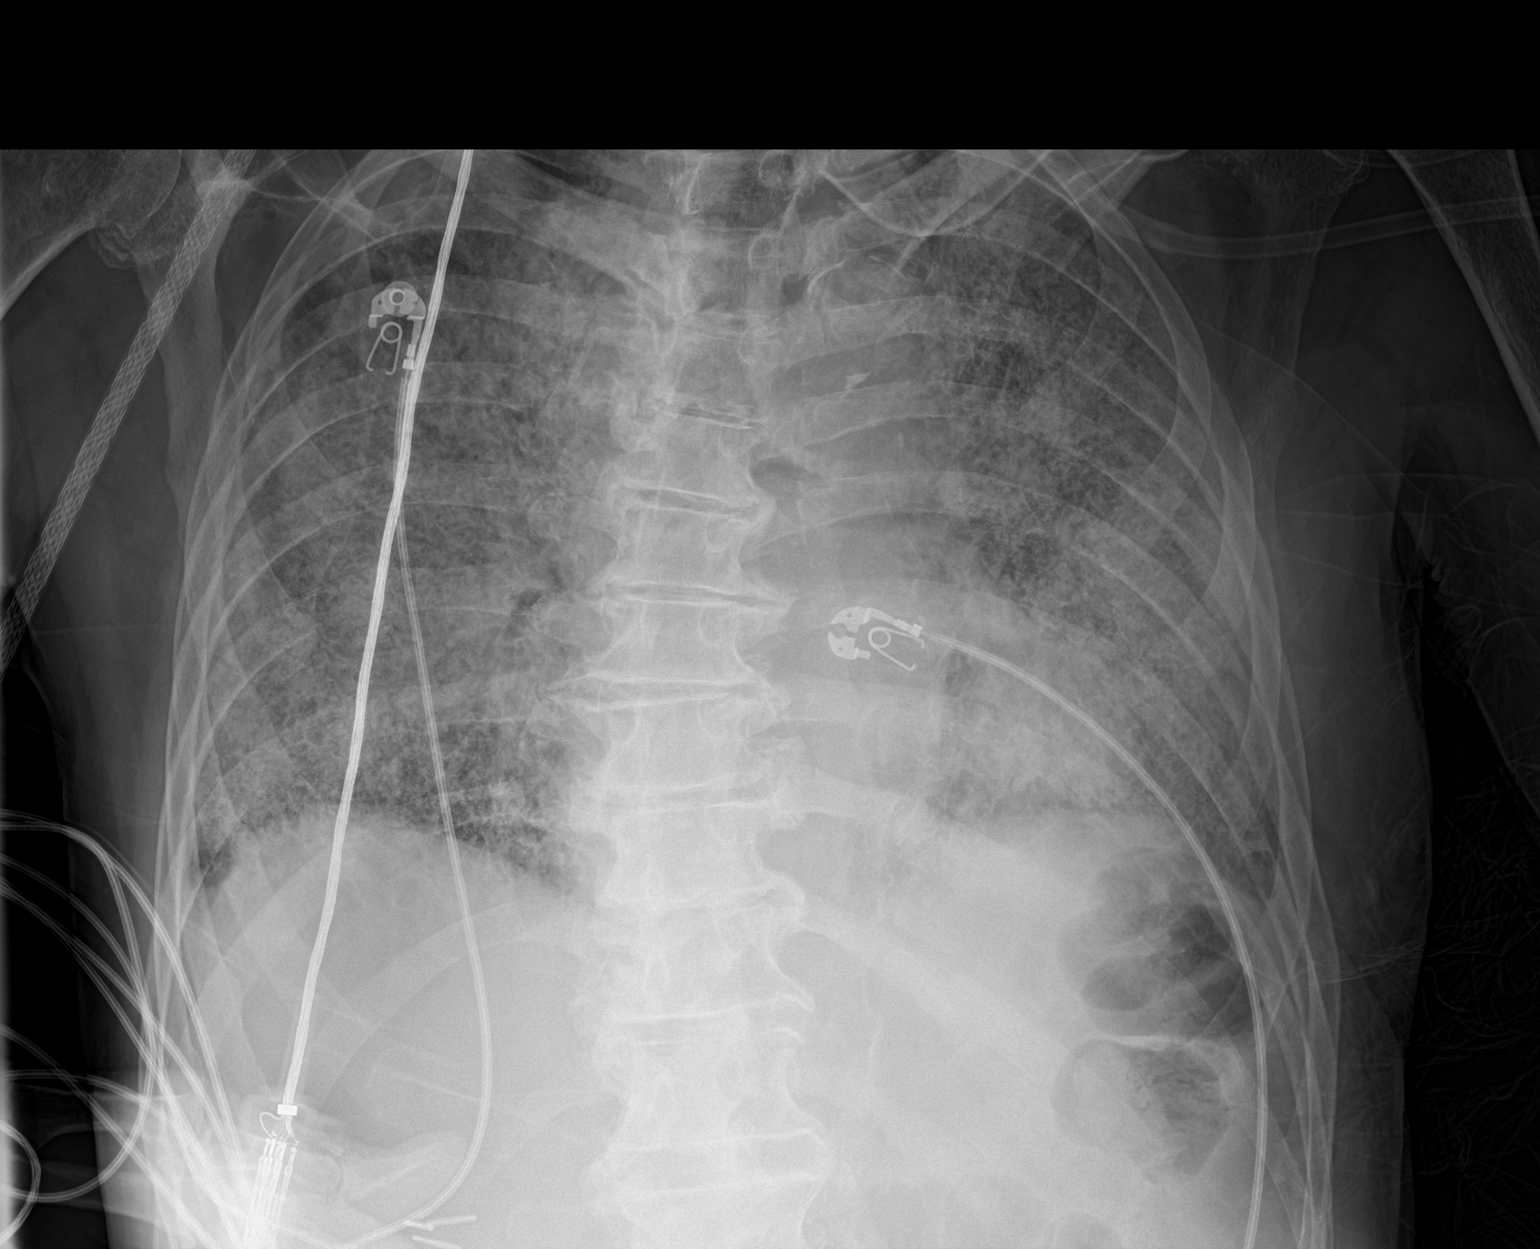

[1 of 1 positions shown; findings below may reference images not displayed]

FINDINGS: The patient is rotated to the left, more so than on the prior study
limiting assessment of the mediastinal structures. There are
worsening interstitial and airspace opacities diffusely throughout
both lungs. No sizable pleural effusion or pneumothorax is
identified.
IMPRESSION: Worsening diffuse bilateral lung opacities compatible with pneumonia
with superimposed edema not excluded.
